# Patient Record
Sex: Female | Born: 1945 | Race: White | Hispanic: No | Marital: Married
Health system: Northeastern US, Academic
[De-identification: ages and names within clinical notes are randomized; demographics above are authoritative.]

---

## 2019-08-11 IMAGING — DX HIP 2 VIEWS LEFT WITH PELVIS
1 series · 3 of 3 positions shown · non-contrast
Comparison: None.

HIP 2 VIEWS LEFT WITH PELVIS, 08/11/2019 [DATE]: 
CLINICAL INDICATION:  Patient fell 2 weeks ago. Low back/left SI joint pain.

[Series 1: AP · U · 0.17mm/px · 3 of 3 slices shown]
[im 1/3]
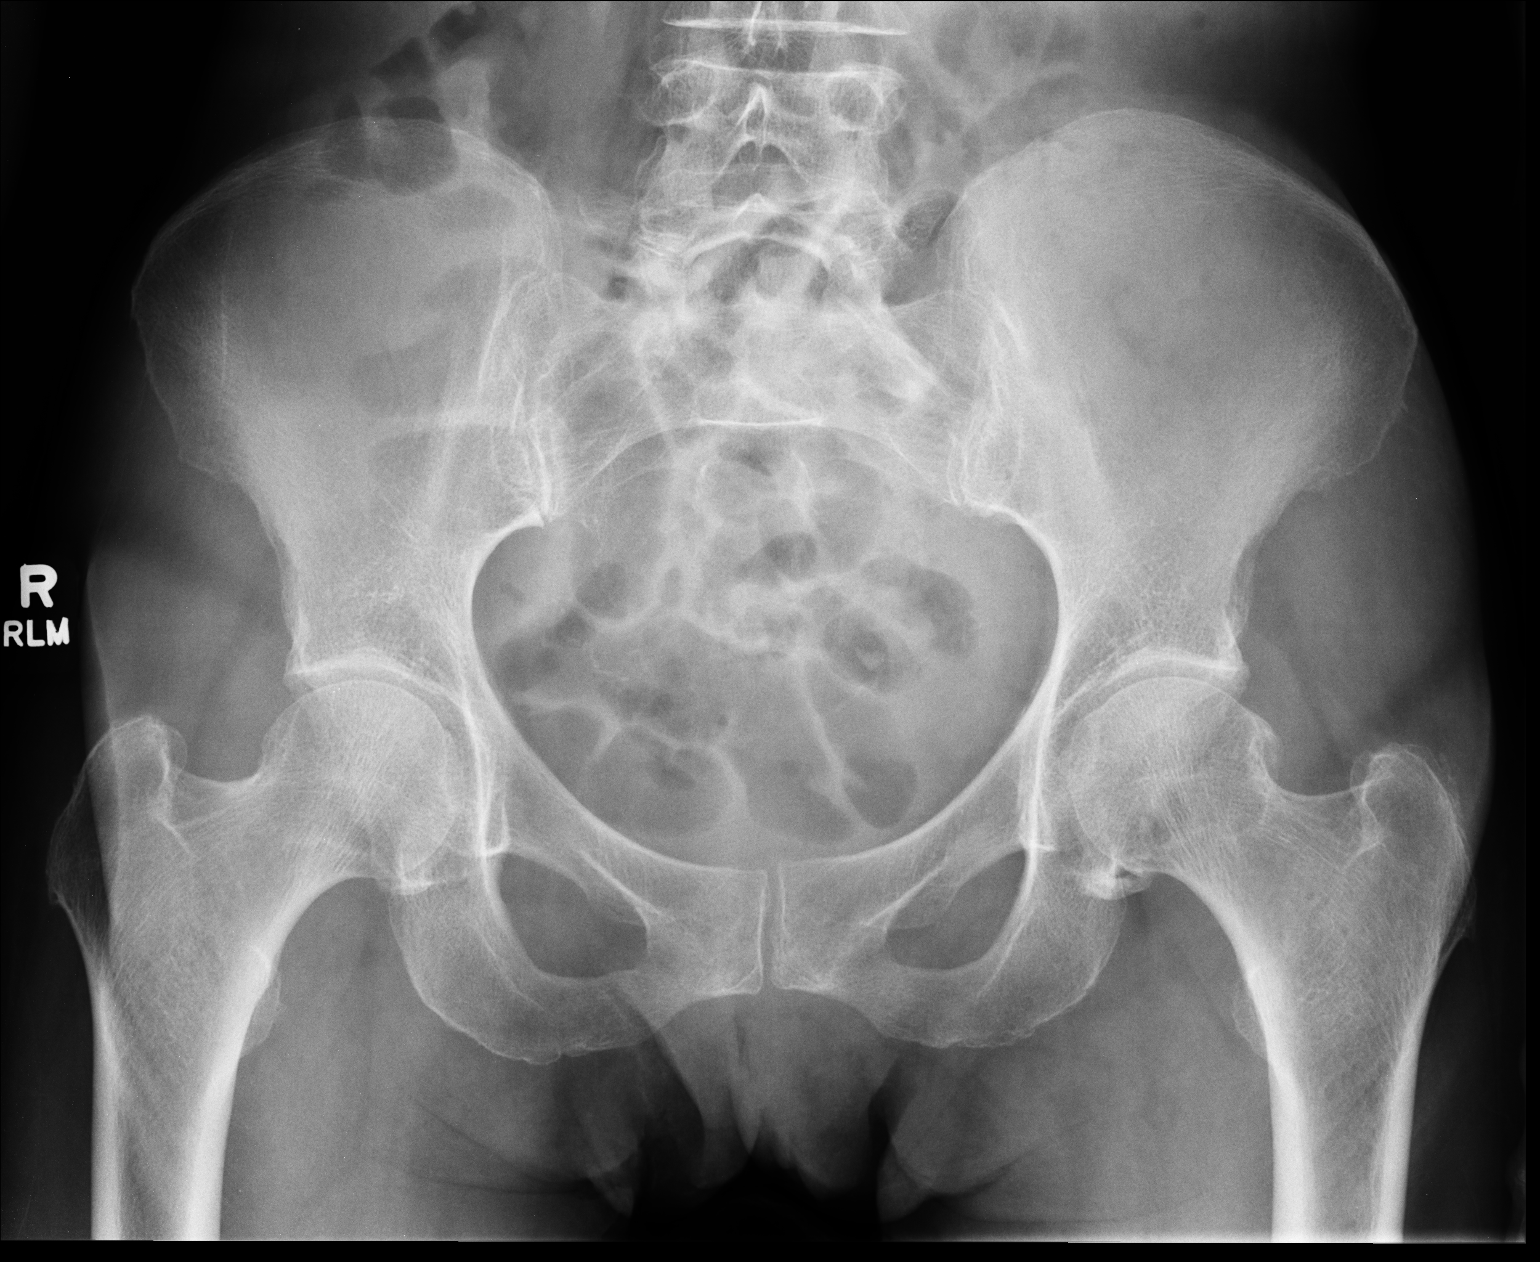
[im 2/3]
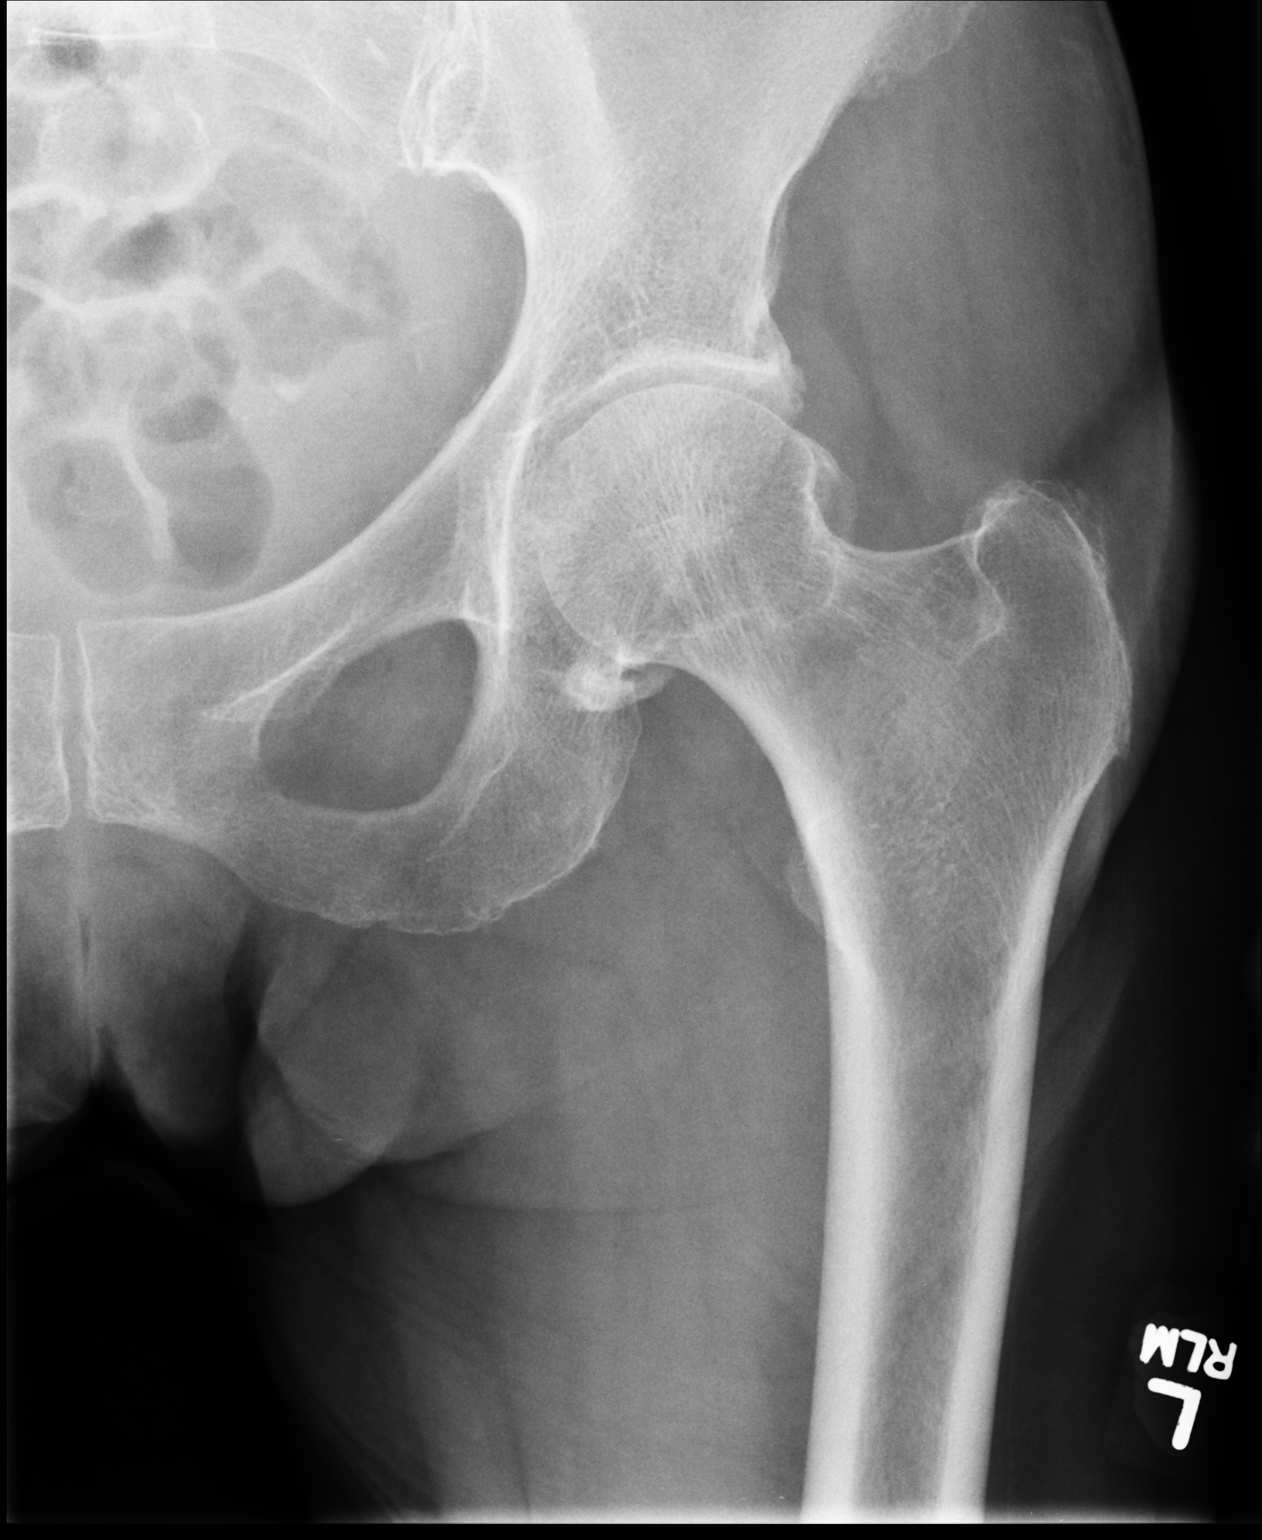
[im 3/3]
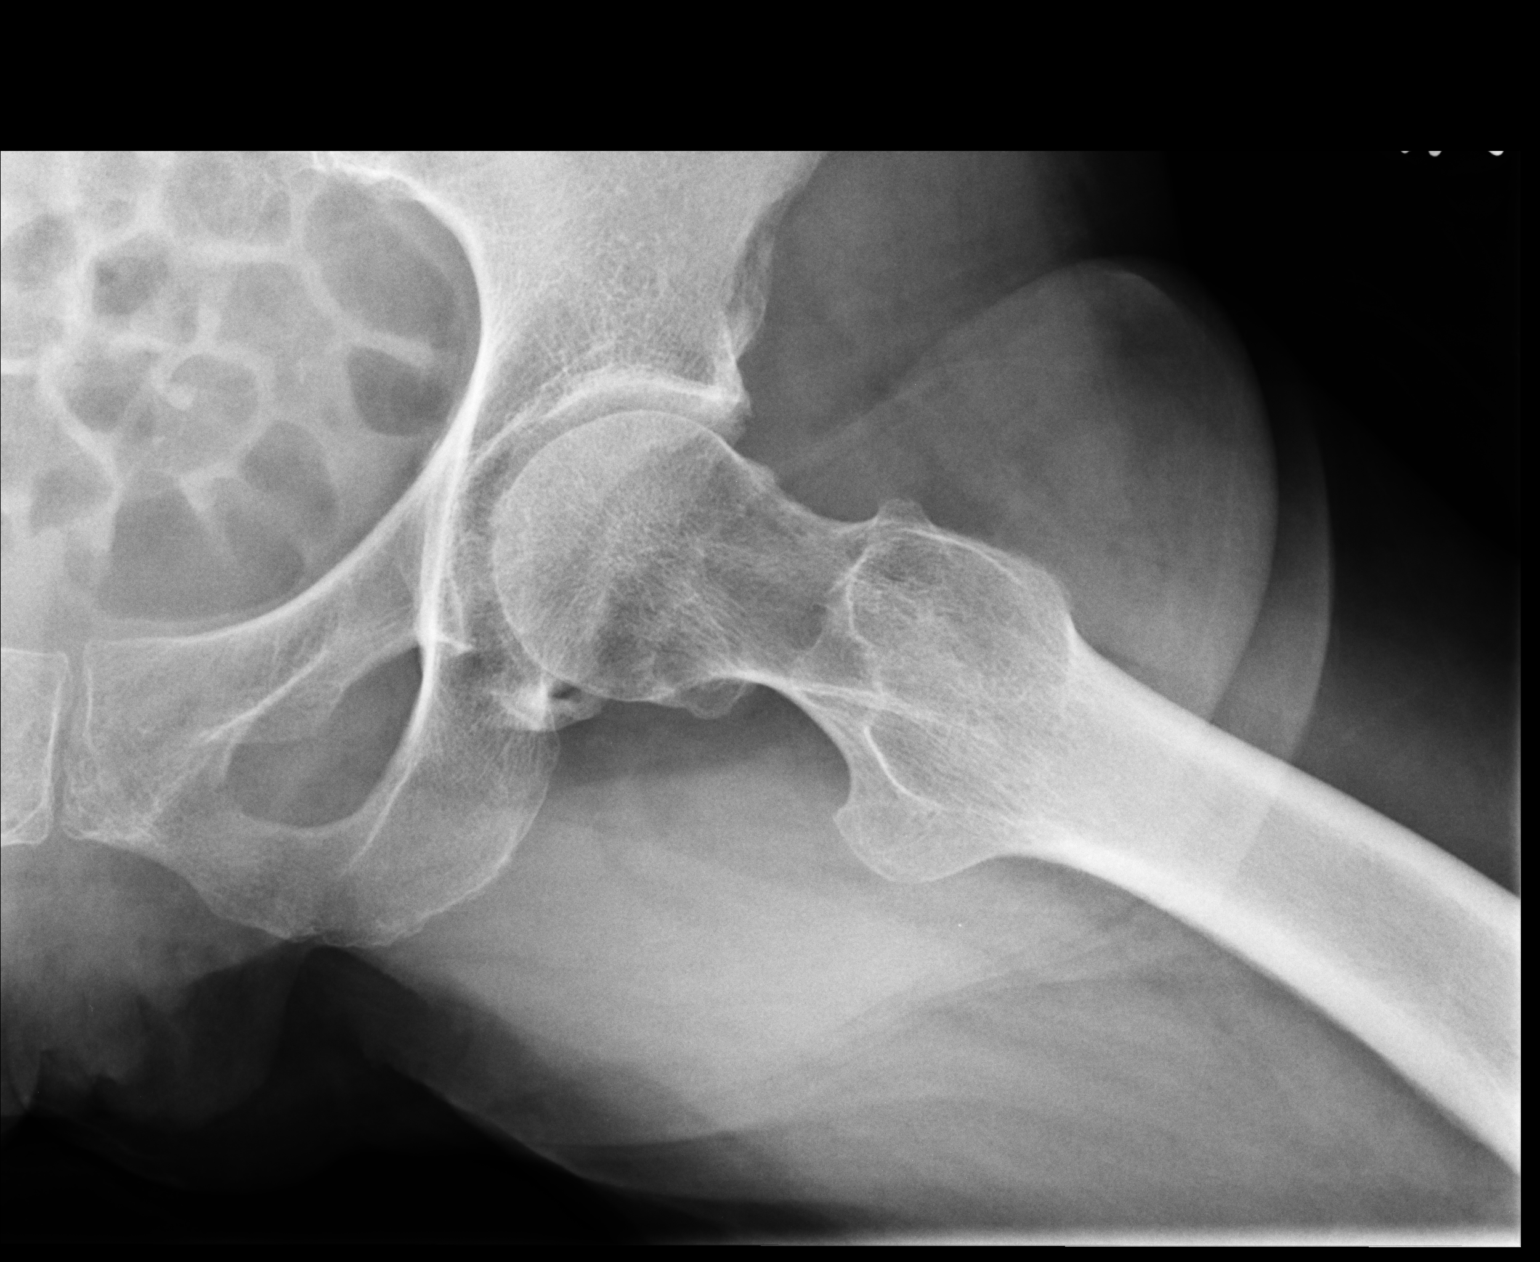

[3 of 3 positions shown; findings below may reference images not displayed]

FINDINGS: Moderate hypertrophic changes of the left hip with preserved superior 
joint space. Normal right hip. 2 mm offset of the pubic symphysis. Enthesopathy. 
Osteopenia. No fracture. Normal alignment. Soft tissues are negative.
IMPRESSION: Moderate degenerative change of the left hip.

## 2019-08-11 IMAGING — DX LUMBAR SPINE COMPLETE 4 VIEWS
1 series · 4 of 4 positions shown · non-contrast
Comparison: None

LUMBAR SPINE COMPLETE 4 VIEWS, 08/11/2019 [DATE]: 
CLINICAL INDICATION: Fell 2 weeks ago

[Series 1: AP · U · 0.17mm/px · 4 of 4 slices shown]
[im 1/4]
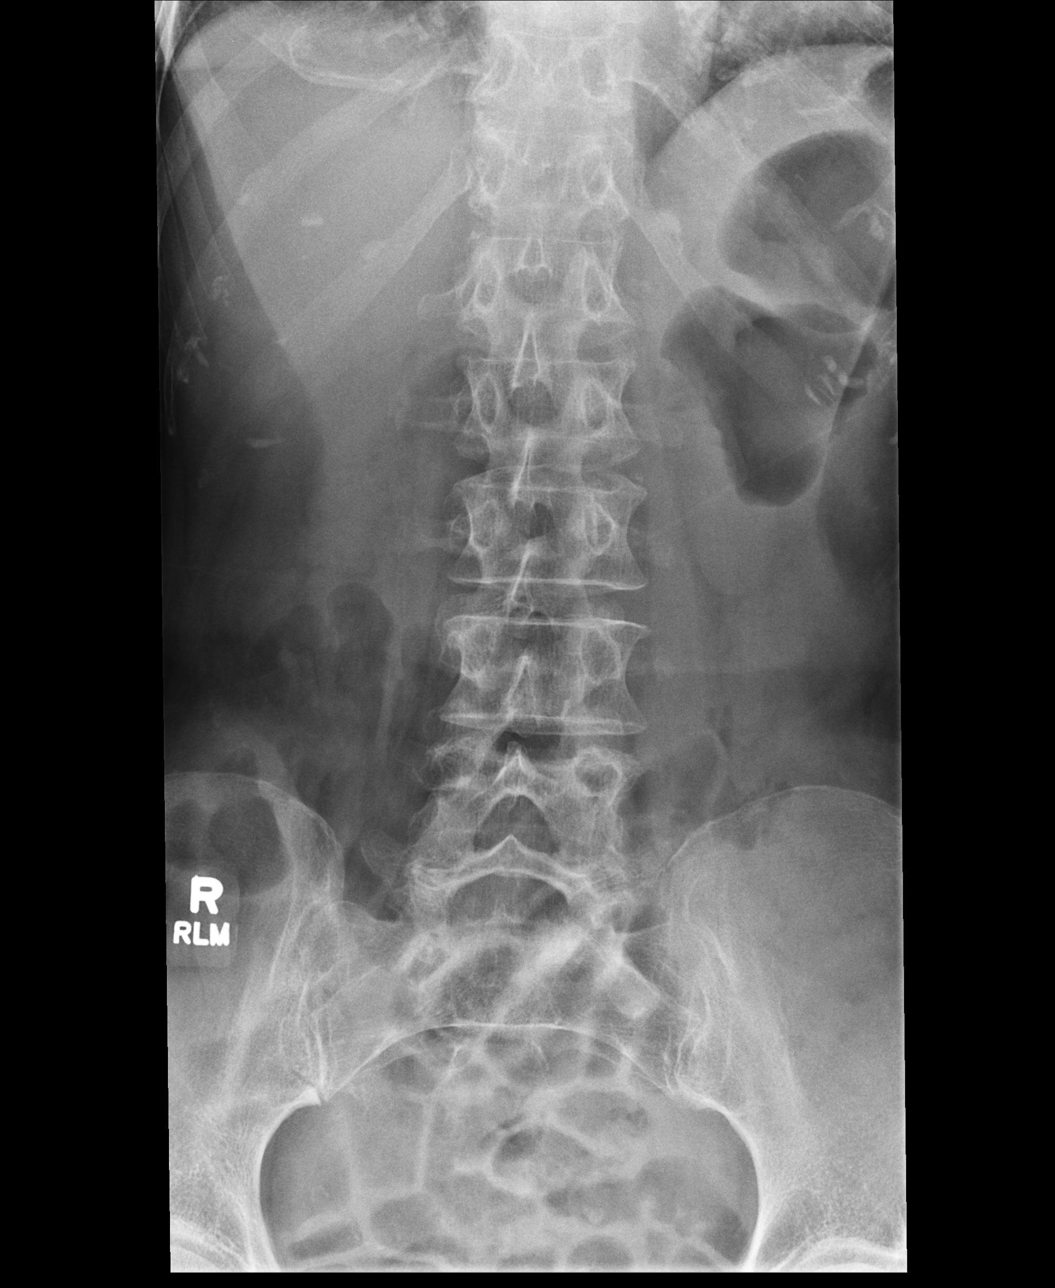
[im 2/4]
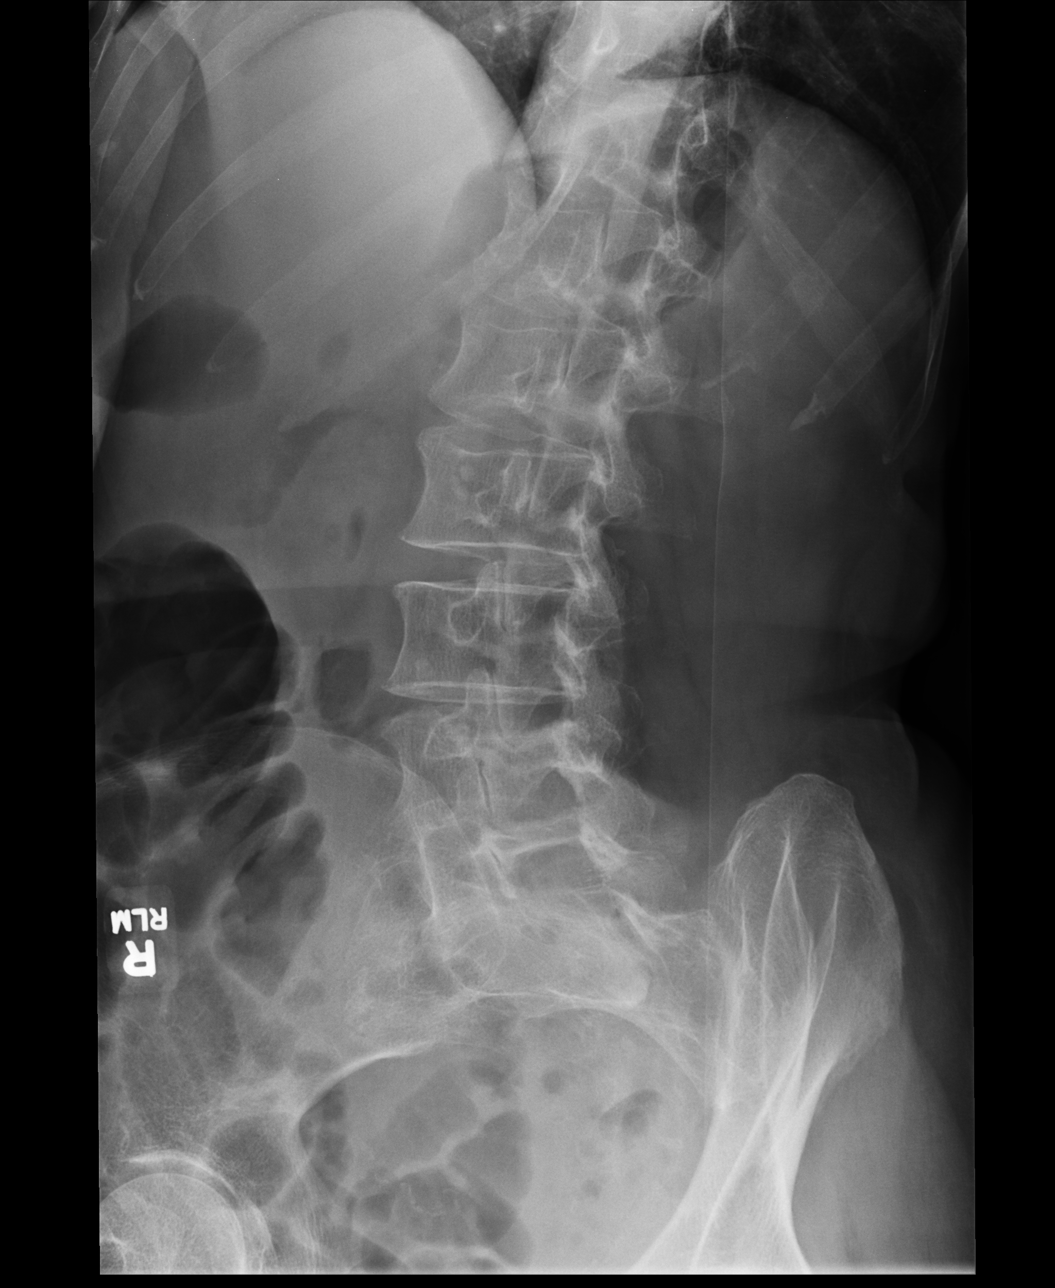
[im 3/4]
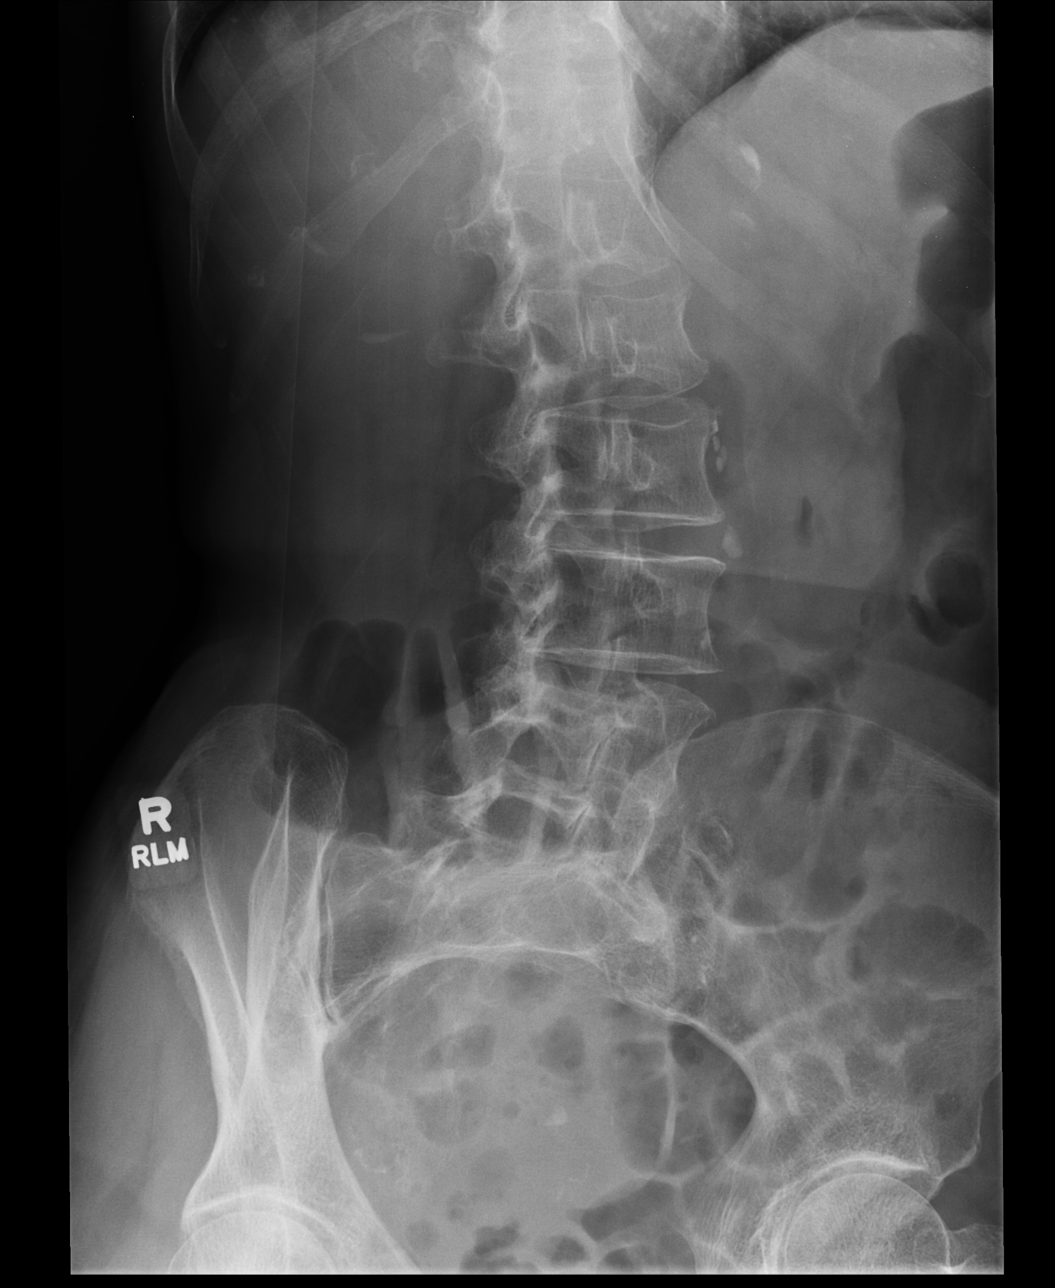
[im 4/4]
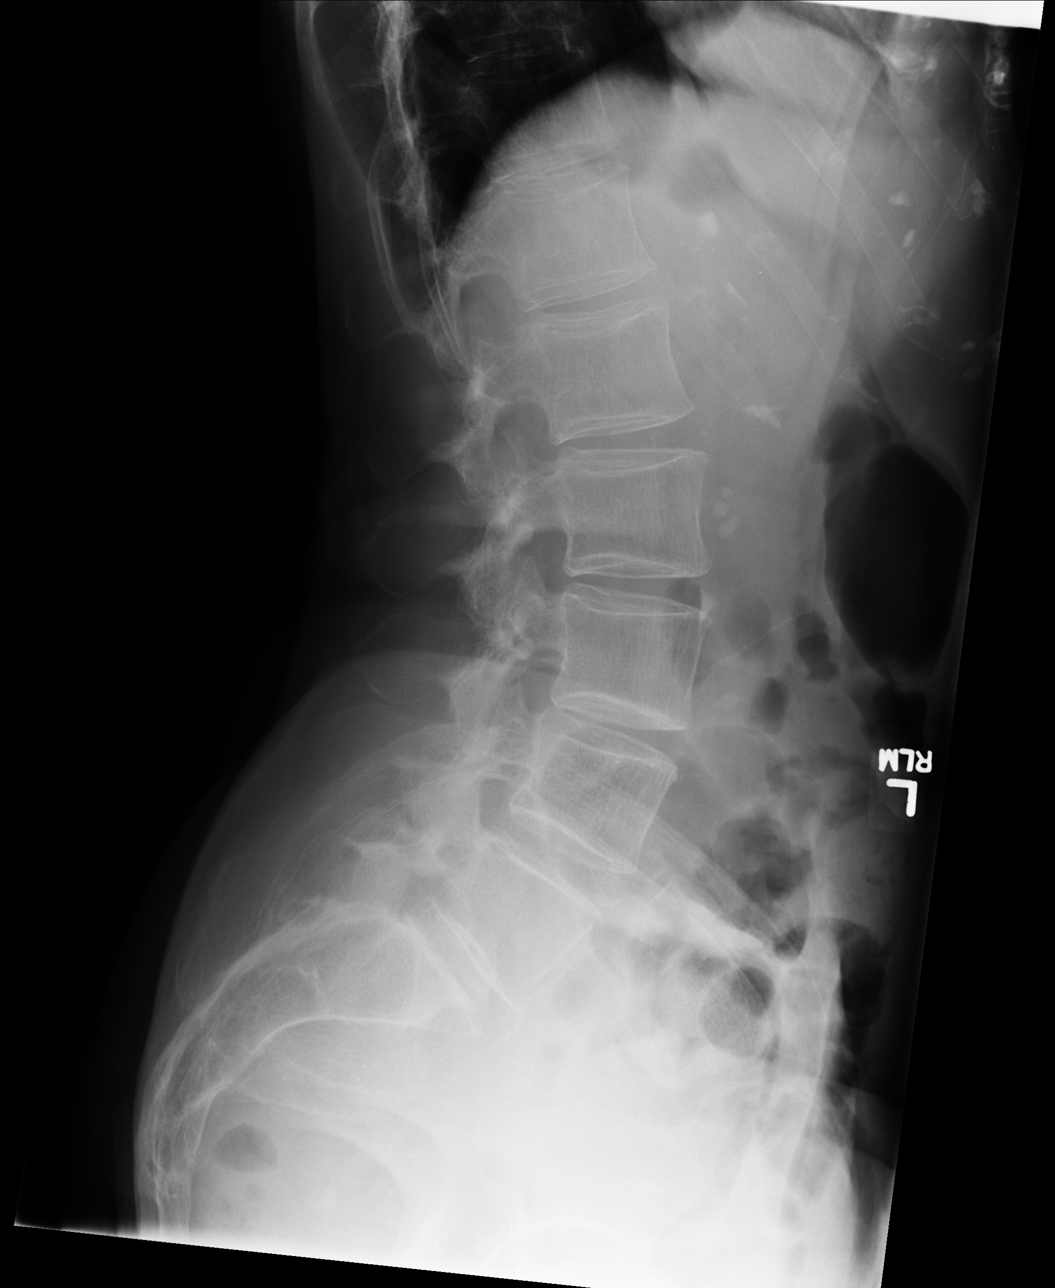

[4 of 4 positions shown; findings below may reference images not displayed]

FINDINGS: Lumbar vertebral heights are intact. There is no listhesis or fracture. No 
spondylolysis. Disc heights are preserved. There are facet changes, most 
pronounced at the lower 3 levels. There is no scoliosis. There are mild to 
moderate SI joint degenerative changes.
IMPRESSION: No evidence for fracture or listhesis. 
Degenerative changes, most pronounced in the lower lumbar facet and SI joints.

## 2019-09-11 ENCOUNTER — Telehealth: Admit: 2019-09-11 | Payer: PRIVATE HEALTH INSURANCE | Attending: Hematology & Oncology

## 2019-09-11 NOTE — Telephone Encounter
Pt called to cancel, she has new insurance and Dr Rico Junker is out of network for her.

## 2019-09-13 ENCOUNTER — Ambulatory Visit: Admit: 2019-09-13 | Payer: PRIVATE HEALTH INSURANCE | Attending: Hematology & Oncology

## 2019-11-04 ENCOUNTER — Encounter: Admit: 2019-11-04 | Payer: PRIVATE HEALTH INSURANCE

## 2019-11-04 ENCOUNTER — Inpatient Hospital Stay: Admit: 2019-11-04 | Discharge: 2019-11-04 | Payer: PRIVATE HEALTH INSURANCE

## 2019-11-04 ENCOUNTER — Emergency Department: Admit: 2019-11-04 | Payer: PRIVATE HEALTH INSURANCE

## 2019-11-04 DIAGNOSIS — C4490 Unspecified malignant neoplasm of skin, unspecified: Secondary | ICD-10-CM

## 2019-11-04 DIAGNOSIS — K5900 Constipation, unspecified: Secondary | ICD-10-CM

## 2019-11-04 DIAGNOSIS — E079 Disorder of thyroid, unspecified: Secondary | ICD-10-CM

## 2019-11-04 DIAGNOSIS — E785 Hyperlipidemia, unspecified: Secondary | ICD-10-CM

## 2019-11-04 DIAGNOSIS — M79641 Pain in right hand: Secondary | ICD-10-CM

## 2019-11-04 DIAGNOSIS — K589 Irritable bowel syndrome without diarrhea: Secondary | ICD-10-CM

## 2019-11-04 DIAGNOSIS — K5790 Diverticulosis of intestine, part unspecified, without perforation or abscess without bleeding: Secondary | ICD-10-CM

## 2019-11-04 MED ORDER — SODIUM CHLORIDE 0.9 % BOLUS (NEW BAG)
0.9 % | INTRAVENOUS | Status: CP
Start: 2019-11-04 — End: ?
  Administered 2019-11-05: 01:00:00 0.9 mL/h via INTRAVENOUS

## 2019-11-04 MED ORDER — SODIUM CHLORIDE 0.9 % (FLUSH) INJECTION SYRINGE
0.9 % | INTRAVENOUS | Status: DC | PRN
Start: 2019-11-04 — End: 2019-11-05
  Administered 2019-11-05: 01:00:00 0.9 mL via INTRAVENOUS

## 2019-11-04 MED ORDER — IOHEXOL 350 MG IODINE/ML INTRAVENOUS SOLUTION
350 mg iodine/mL | Freq: Once | INTRAVENOUS | Status: CP | PRN
Start: 2019-11-04 — End: ?
  Administered 2019-11-05: 01:00:00 350 mL via INTRAVENOUS

## 2019-11-04 NOTE — ED Notes
Patient reports a long standing hx of bowel issues.  Reports that she has been constipated for a while   Took ducolax last night and did have a BM but reports that she does not feel empty.  Patient reports a fullness to her abd, slight pain. Denies any nausea or vomiting

## 2019-11-05 DIAGNOSIS — G8929 Other chronic pain: Secondary | ICD-10-CM

## 2019-11-05 DIAGNOSIS — Z85828 Personal history of other malignant neoplasm of skin: Secondary | ICD-10-CM

## 2019-11-05 DIAGNOSIS — K589 Irritable bowel syndrome without diarrhea: Secondary | ICD-10-CM

## 2019-11-05 DIAGNOSIS — R109 Unspecified abdominal pain: Secondary | ICD-10-CM

## 2019-11-05 DIAGNOSIS — E785 Hyperlipidemia, unspecified: Secondary | ICD-10-CM

## 2019-11-05 DIAGNOSIS — Z8719 Personal history of other diseases of the digestive system: Secondary | ICD-10-CM

## 2019-11-05 DIAGNOSIS — Z79899 Other long term (current) drug therapy: Secondary | ICD-10-CM

## 2019-11-05 DIAGNOSIS — Z86010 Personal history of colonic polyps: Secondary | ICD-10-CM

## 2019-11-05 LAB — BASIC METABOLIC PANEL
BKR ANION GAP (LM): 6 mmol/L (ref 5–15)
BKR BLOOD UREA NITROGEN: 13 mg/dL (ref 7–18)
BKR CALCIUM: 9.1 mg/dL — ABNORMAL LOW (ref 8.5–10.1)
BKR CHLORIDE: 101 mmol/L (ref 98–107)
BKR CO2: 26 mmol/L (ref 21–32)
BKR CREATININE: 0.61 mg/dL (ref 0.55–1.02)
BKR EGFR (AFR AMER) (LMC): >60 mL/min/{1.73_m2} (ref >60)
BKR EGFR (NON AFR AMER) (LMC): >60 mL/min/{1.73_m2} (ref >60)
BKR GLUCOSE: 93 mg/dL (ref 65–110)
BKR POTASSIUM: 4.1 mmol/L (ref 3.5–5.1)
BKR SODIUM: 133 mmol/L — ABNORMAL LOW (ref 136–145)
BKR WAM RDW-CV: 6 mmol/L (ref 5–15)

## 2019-11-05 LAB — CBC WITH AUTO DIFFERENTIAL
BKR WAM ABSOLUTE IMMATURE GRANULOCYTES.: 0.01 x 1000/??L (ref 0.00–0.10)
BKR WAM ABSOLUTE LYMPHOCYTE COUNT.: 1.23 x 1000/??L (ref 1.00–4.50)
BKR WAM ABSOLUTE NEUTROPHIL COUNT.: 2.71 x 1000/??L (ref 1.40–6.60)
BKR WAM BASOPHIL ABSOLUTE COUNT.: 0.05 x 1000/??L (ref 0.0–0.3)
BKR WAM BASOPHILS: 1.1 % (ref 0.0–3.0)
BKR WAM EOSINOPHIL ABSOLUTE COUNT.: 0.04 x 1000/??L (ref 0.00–0.45)
BKR WAM EOSINOPHILS: 0.9 % (ref 0.0–4.0)
BKR WAM HEMATOCRIT: 34.3 % (ref 34.0–45.0)
BKR WAM HEMOGLOBIN: 11.3 g/dL (ref 11.0–15.0)
BKR WAM IMMATURE GRANULOCYTES: 0.2 % (ref 0.0–1.0)
BKR WAM LYMPHOCYTES: 27.6 % (ref 25.0–45.0)
BKR WAM MCH PG: 34.1 pg — ABNORMAL HIGH (ref 27–33)
BKR WAM MCHC: 32.9 g/dL (ref 32.0–36.0)
BKR WAM MCV: 103.6 fL — ABNORMAL HIGH (ref 79.0–99.0)
BKR WAM MONOCYTE ABSOLUTE COUNT.: 0.42 x 1000/??L (ref 0.00–1.20)
BKR WAM MONOCYTES: 9.4 % (ref 0.0–12.0)
BKR WAM MPV: 10.3 fL (ref 7.5–11.5)
BKR WAM NEUTROPHILS: 60.8 % (ref 36.0–66.0)
BKR WAM NUCLEATED RED BLOOD CELLS: 0 % (ref 0.0–5.0)
BKR WAM PLATELETS: 124 x1000/ÂµL — ABNORMAL LOW (ref 140–400)
BKR WAM RED BLOOD CELL COUNT.: 3.31 M/ÂµL — ABNORMAL LOW (ref 4.00–5.20)
BKR WAM WHITE BLOOD CELL COUNT.: 4.46 x1000/ÂµL (ref 4.00–10.00)

## 2019-11-05 NOTE — ED Notes
8:05 PM Patient is awake and alert, reports many years of intermittent constipation w/ reliance on laxatives to have a BM.  Today patient c/o fullness and nausea following a month of constipation, patient has had BM 2 days ago and today but still feels constipated.  Denies vomiting, chest pain or SOB.

## 2019-11-08 NOTE — ED Provider Notes
Chief Complaint Patient presents with ? Constipation Debra [name] is a 74 y.o. female  who presents to the ED complaining of constipation.  States that it has been severe.  Then notes that she has had several episodes of soft stool today and yesterday as well.  Hx of irritable bowel syndrome. Occasional cramping abdominal pain. No other associated symptoms or modifying factors.Past Medical History: Diagnosis Date ? Bilateral hand pain 11/11/2017 ? Disease of thyroid gland  ? Diverticulosis  ? Hyperlipidemia  ? IBS (irritable bowel syndrome)  ? Skin cancer  Past Surgical History: Procedure Laterality Date ? CATARACT REMOVAL/IOL IMPLANT   ? COLONOSCOPY  11/25/2010  Diverticulosis of the sigmoid colon ? COLONOSCOPY  02/26/2015  Diverticulosis of the sigmoid colon. Polyps (3 mm to 4 mm) in the mid-ascending colon. (Polypectomy). Polyp (4 mm to 6 mm) in the distal ascending colon. (Polypectomy).  ? EGD/Colonoscopy  07/20/2005  Nonerosive antral gastritis; diverticulosis coli. Bx shows chronic non specific inflammation ? EGD/Colonoscopy  09/11/1999  Normal EGD; sigmoid diverticulosis.  ? ESOPHAGOGASTRODUODENOSCOPY  04/16/2009  Abnormally visible vascularity with absence of folds in the gastric body compatible with gastric mucosal atrophy. (Biopsy) ? MOLE REMOVAL  2006 ? OOPHORECTOMY  1961 ? SKIN CANCER EXCISION    R arm (2017) and R ankle (2003) ? THYROIDECTOMY  1969 Family History Problem Relation Age of Onset ? Diabetes Daughter  ? Breast cancer Maternal Aunt  ? Colon cancer Maternal Aunt  ? Colon cancer Maternal Grandfather  Social History Socioeconomic History ? Marital status: Married   Spouse name: Not on file ? Number of children: Not on file ? Years of education: Not on file ? Highest education level: Not on file Tobacco Use ? Smoking status: Never Smoker ? Smokeless tobacco: Never Used Substance and Sexual Activity ? Alcohol use: Yes   Alcohol/week: 2.0 standard drinks   Types: 2 Glasses of wine per week ? Drug use: No ED Other Social History Review of Systems:All systems are reviewed except as noted above or in the HPI are negative.Vitals:  11/04/19 1845 11/04/19 2215 BP: 132/87 130/71 Pulse: 66 66 Resp: 17 16 Temp: 97 ?F (36.1 ?C)  TempSrc: Temporal  SpO2: 100%  Weight: 50.5 kg  Physical Exam:General: WNWD, Alert.  No acute distress.  Eyes: Pupils equal, round and reactive to light.  ENT: Mucous membranes moistNeck: Neck supple.  CVS:  Regular rateRespiratory:  No increased work of breathingAbdomen: Soft and nontender.  No rebound, rigidity or guarding.Back: Normal inspection.  Skin: Warm and dry and well perfused.  No lesions.Extremities: No lower extremity edema.  Neuro: Oriented X 3, ambulates well in the department.Psych: Calm and cooperative.  Mood and affect normal.Procedures:LABSI have reviewed all labs for this visit. No results found for this or any previous visit (from the past 24 hour(s)).The 12 lead EKG was interpreted by me as follows:RADIOLOGYCt Abdomen Pelvis Mardelle Matte Date: 7/11/2021[facility] [address] [place], [place] [address] [phone] [place] ABDOMEN PELVIS W IV CONTRAST Debra [name]       SexCarmon Ginsberg  DOB: [id number]  MRN: [id number] Service Date: 2019-11-04 20:54:56 [place] ABDOMEN PELVIS W IV CONTRAST Clinical Indication:  Abdominal pain, acute, nonlocalized Contrast:  90 ml of Omnipaque 350 The [place] scan was performed in the axial projection.  Sagittal and coronal reformatted images were obtained.  The [place] scan of the abdomen and pelvis was performed with the use of intravenous contrast. The patient did not receive oral contrast. Study is compared to a prior study from  May 26, 2016. There are several small low-density masses in the liver which probably represents a cyst. Some of these are too small to accurately characterize. Spleen is normal in size. The pancreas is not well visualized but is likely atrophic. The gallbladder is also not adequately visualized. There is no hydronephrosis of the kidneys. There is a right renal cyst. The abdominal aorta is normal caliber. Evaluation of the bowel is limited without the use of oral contrast and due to the paucity of intra-abdominal fat. There are several air-fluid levels seen within the small bowel which are nonspecific and could be due to enteritis. A mild or partial small-bowel obstruction cannot be excluded. There is air and stool seen in the colon. No obvious diverticulitis is seen however the colon is also difficult to evaluate. There is no evidence of obstruction of the colon. No obvious pelvic mass or free fluid is seen. Impression:  Evaluation of the bowel is somewhat limited without the use of oral contrast and due to the paucity of intra-abdominal fat. There are multiple air-fluid levels seen within the small bowel. The findings could be due to enteritis. A mild partial small-bowel obstruction cannot be excluded. Clinical correlation and if indicated follow up evaluation is suggested. Z6109 - Radiation dose acquired during scan: 167.19 mGy.cm mGy/cm . The [employer] strives for high quality imaging with the lowest possible radiation dose.  For more information on medical radiation exposure please visit:  [email or web address] . Signed By: Dalbert Batman MD, 2019/11/05 22:57:52Medications iohexoL (OMNIPAQUE) 350 mg iodine/mL injection 90 mL (90 mLs Intravenous Given 11/04/19 2055) sodium chloride 0.9 % (new bag) bolus 60 mL (0 mLs Intravenous Stopped 11/04/19 2247) Clinical Impressions as of Nov 06 2252 Chronic abdominal pain Clinical Impression: Chronic abdominal painSafe to be discharged to home.  Patient was advised to follow-up with PCP and/or specialty service in a timely manner.  Patient was advised to return to the ER should the condition change or should the condition worsen. Additional verbal instructions discussed with the patient at the bedside. Did voice understanding of those instructions. Patient did thank [place] for our care here today. Patient was discharged in stable condition.ED Disposition:Discharge Traci Sermon, DO07/13/21 2300

## 2020-01-22 ENCOUNTER — Encounter: Admit: 2020-01-22 | Payer: PRIVATE HEALTH INSURANCE

## 2020-01-22 DIAGNOSIS — K5900 Constipation, unspecified: Secondary | ICD-10-CM

## 2020-01-24 ENCOUNTER — Encounter: Admit: 2020-01-24 | Payer: PRIVATE HEALTH INSURANCE

## 2020-01-24 DIAGNOSIS — K5909 Other constipation: Secondary | ICD-10-CM

## 2020-01-29 ENCOUNTER — Inpatient Hospital Stay: Admit: 2020-01-29 | Discharge: 2020-01-29 | Payer: PRIVATE HEALTH INSURANCE

## 2020-01-29 DIAGNOSIS — K5900 Constipation, unspecified: Secondary | ICD-10-CM

## 2020-01-30 ENCOUNTER — Inpatient Hospital Stay: Admit: 2020-01-30 | Discharge: 2020-01-30 | Payer: PRIVATE HEALTH INSURANCE

## 2020-01-30 DIAGNOSIS — K5909 Other constipation: Secondary | ICD-10-CM

## 2020-01-31 ENCOUNTER — Inpatient Hospital Stay: Admit: 2020-01-31 | Discharge: 2020-01-31 | Payer: PRIVATE HEALTH INSURANCE

## 2020-01-31 DIAGNOSIS — K5909 Other constipation: Secondary | ICD-10-CM

## 2020-02-01 ENCOUNTER — Inpatient Hospital Stay: Admit: 2020-02-01 | Discharge: 2020-02-01 | Payer: PRIVATE HEALTH INSURANCE

## 2020-02-01 DIAGNOSIS — K5909 Other constipation: Secondary | ICD-10-CM

## 2020-02-02 ENCOUNTER — Encounter: Admit: 2020-02-02 | Payer: PRIVATE HEALTH INSURANCE

## 2020-02-02 ENCOUNTER — Inpatient Hospital Stay: Admit: 2020-02-02 | Discharge: 2020-02-02 | Payer: PRIVATE HEALTH INSURANCE

## 2020-02-02 DIAGNOSIS — K5900 Constipation, unspecified: Secondary | ICD-10-CM

## 2020-02-02 DIAGNOSIS — K5909 Other constipation: Secondary | ICD-10-CM

## 2020-02-03 ENCOUNTER — Inpatient Hospital Stay: Admit: 2020-02-03 | Discharge: 2020-02-03 | Payer: PRIVATE HEALTH INSURANCE

## 2020-02-03 DIAGNOSIS — K5900 Constipation, unspecified: Secondary | ICD-10-CM

## 2020-03-24 ENCOUNTER — Encounter: Admit: 2020-03-24 | Payer: PRIVATE HEALTH INSURANCE | Attending: Hematology & Oncology

## 2020-03-24 DIAGNOSIS — E538 Deficiency of other specified B group vitamins: Secondary | ICD-10-CM

## 2020-03-25 MED ORDER — BD LUER-LOK SYRINGE 3 ML 25 X 5/8
3 mL 25 x 5/8" | 2 refills | Status: AC
Start: 2020-03-25 — End: ?

## 2020-03-25 MED ORDER — CYANOCOBALAMIN (VIT B-12) 1,000 MCG/ML INJECTION SOLUTION
1000 mcg/mL | INTRAMUSCULAR | 2 refills | Status: AC
Start: 2020-03-25 — End: ?

## 2020-07-09 ENCOUNTER — Encounter: Admit: 2020-07-09 | Payer: PRIVATE HEALTH INSURANCE

## 2020-07-29 IMAGING — MR MRI LUMBAR SPINE WITHOUT CONTRAST
4 of 6 series · 22 of 48 positions shown · IV contrast (gadolinium)
Comparison: 08/11/2019 radiographs

MRI LUMBAR SPINE WITHOUT CONTRAST, 07/29/2020 [DATE]: 
CLINICAL INDICATION: Chronic low back pain radiating to the right leg. History 
of lumbar discectomy in 3687.
TECHNIQUE: Multiplanar, multiecho position MR images of the lumbar spine were 
performed without intravenous gadolinium enhancement.

[Series 101: survey · axial · 10.0mm · 1.39mm/px · z∈[+15,+199]mm · 3 of 14 slices shown]
[im 3/14]
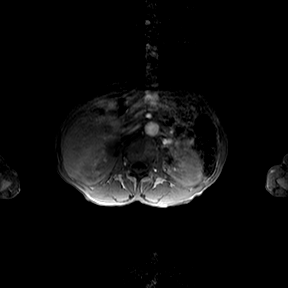
[im 8/14]
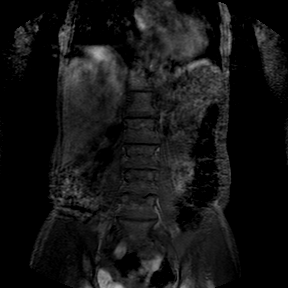
[im 14/14]
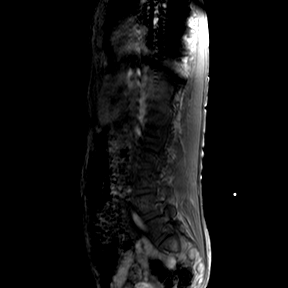

[Series 301: t1w tse sag · sagittal · 4.0mm · 0.48mm/px · 3 of 17 slices shown]
[im 4/17]
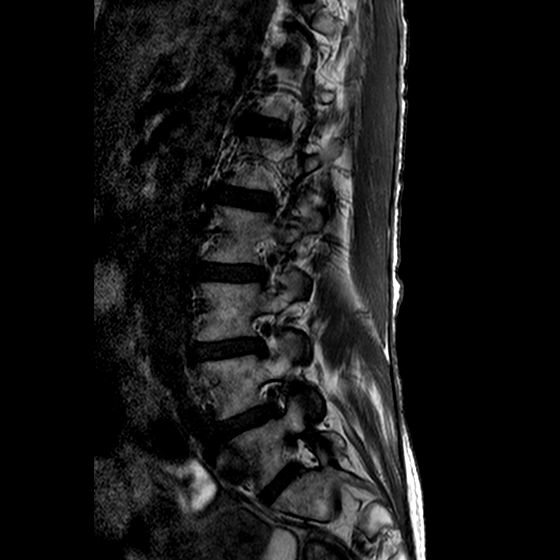
[im 10/17]
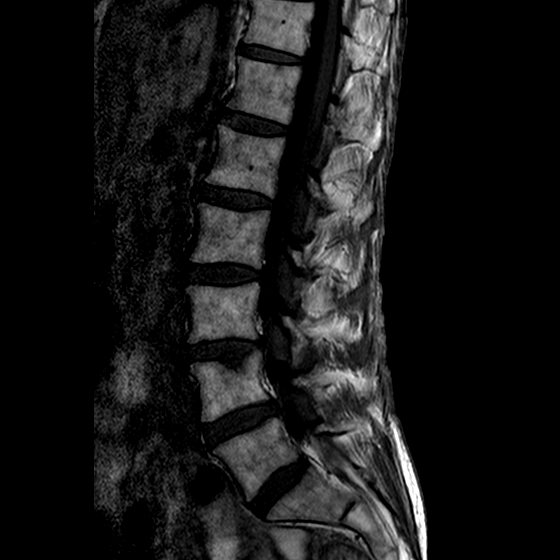
[im 17/17]
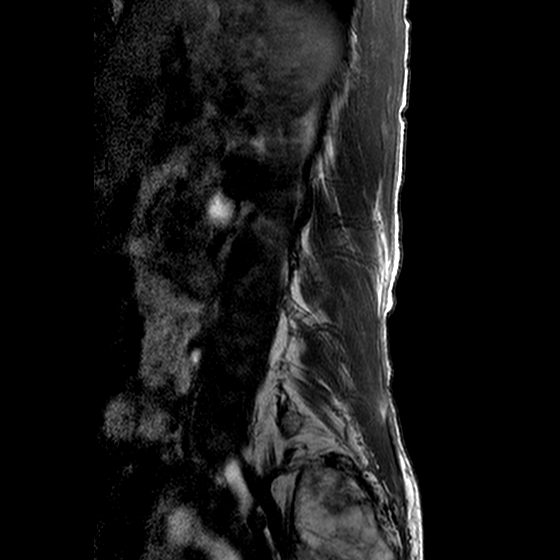

[Series 601: T2 · oblique · 4.0mm · 0.47mm/px · 11 of 31 slices shown]
[im 1/31]
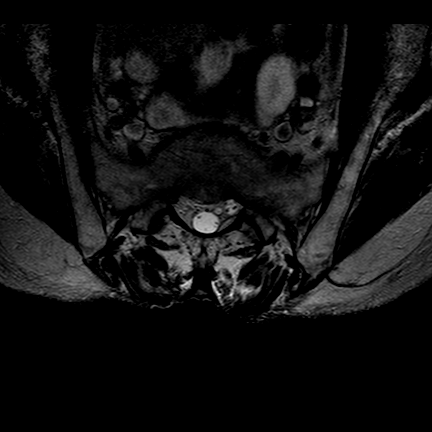
[im 4/31]
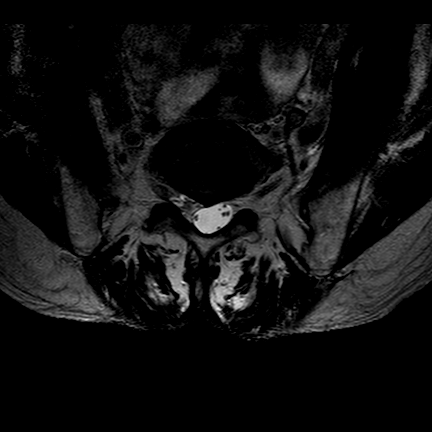
[im 7/31]
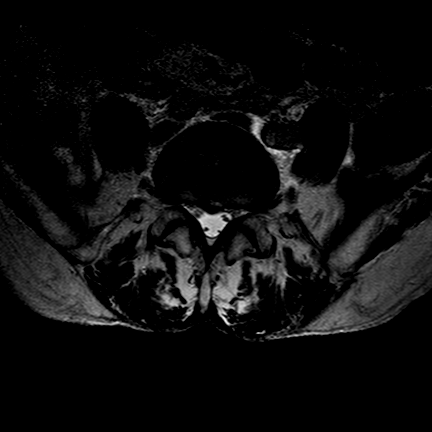
[im 10/31]
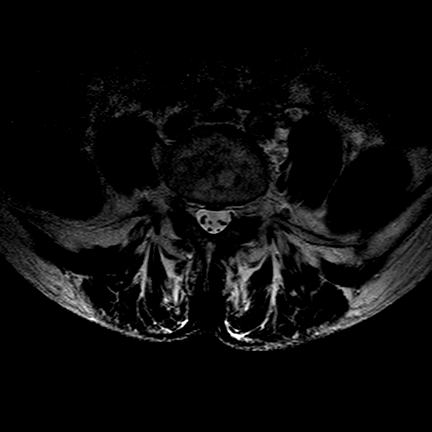
[im 13/31]
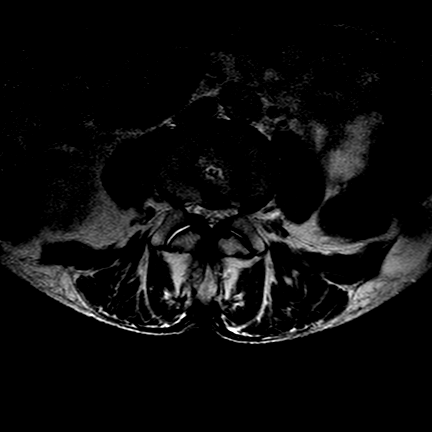
[im 16/31]
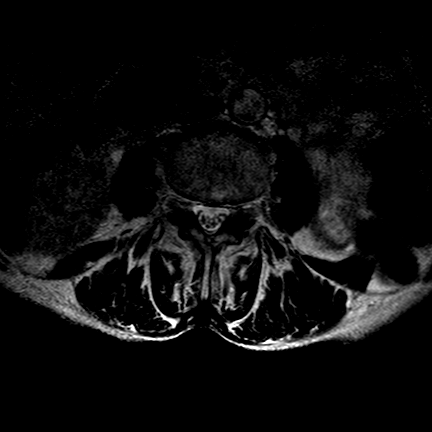
[im 19/31]
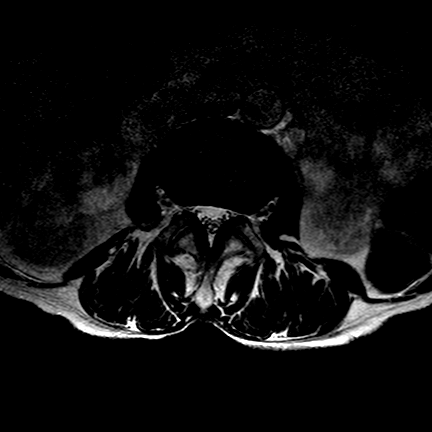
[im 22/31]
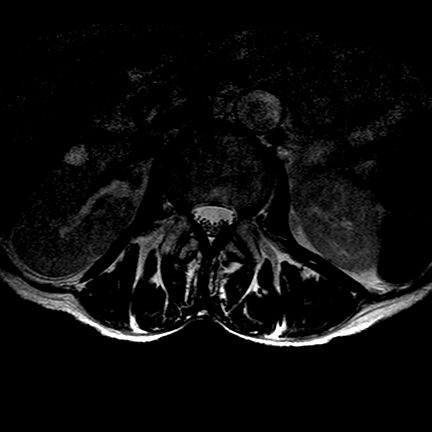
[im 25/31]
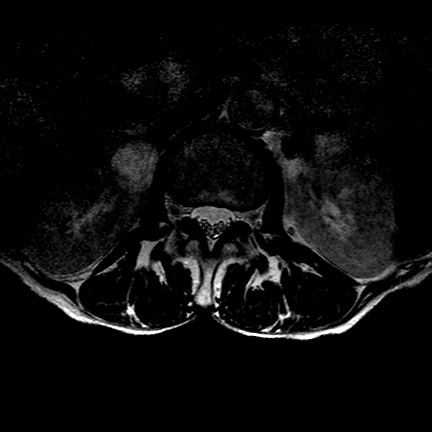
[im 28/31]
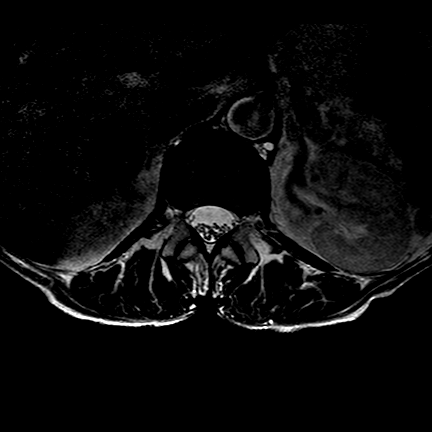
[im 31/31]
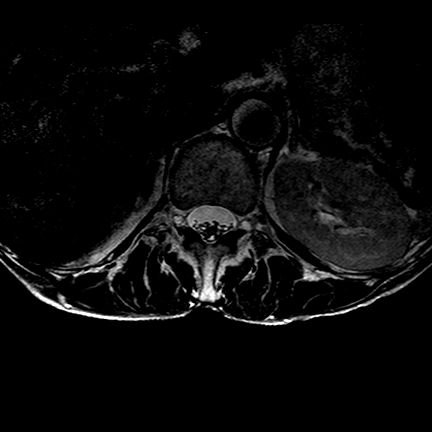

[Series 701: T1 · oblique · 4.0mm · 0.35mm/px · 5 of 35 slices shown]
[im 1/35]
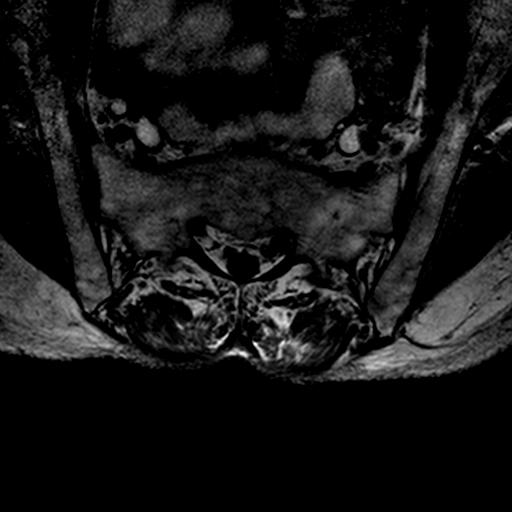
[im 6/35]
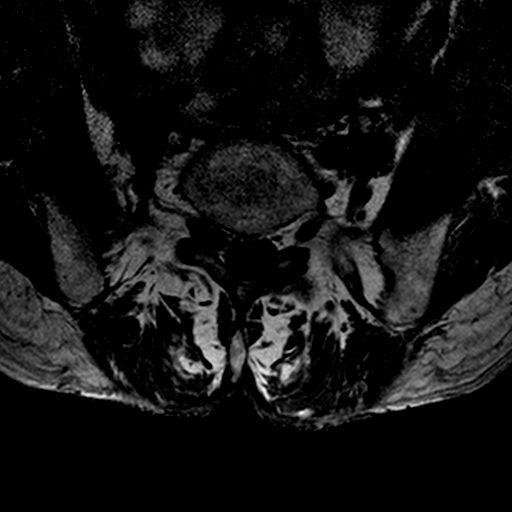
[im 12/35]
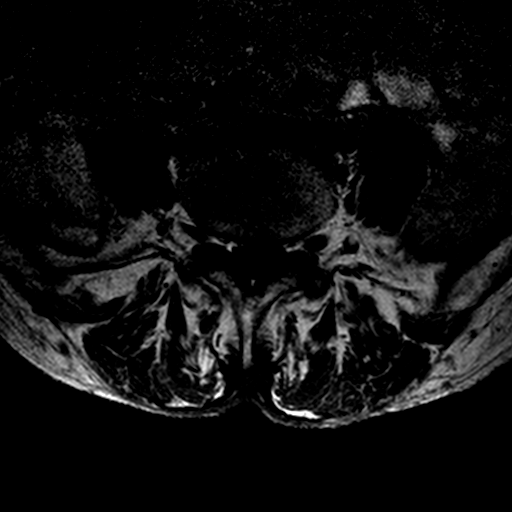
[im 18/35]
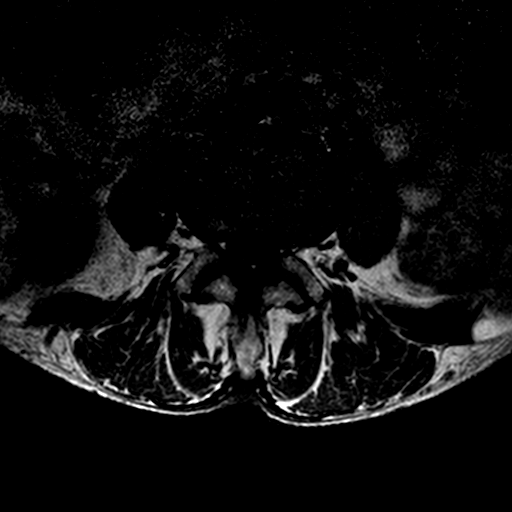
[im 29/35]
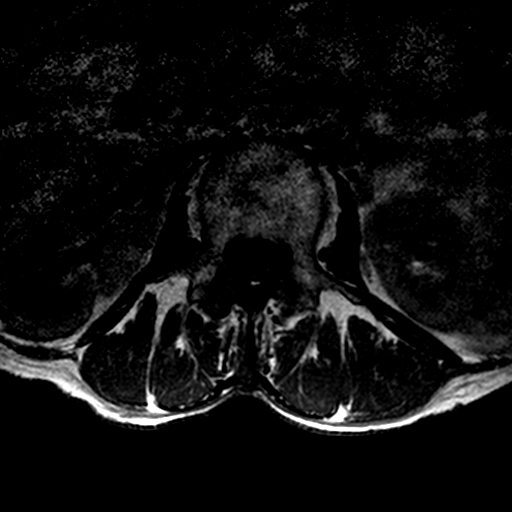

[22 of 48 positions shown; findings below may reference images not displayed]

FINDINGS: Nomenclature is based on 5 lumbar type vertebral bodies.  Mild chronic 
posterior L4 vertebral body superior endplate compression deformity (19% loss of 
height) and Schmorl node, with mild marrow edema-like signal changes in the 
posterior vertebral body. Multilevel degenerative change. 2 mm anterior 
positioning of L3 upon L4. No abnormal marrow signal intensity. No pars defect. 
The conus tip terminates at the T12-L1 level. 
Modic I-II: None. 
Ligamentum Flavum > 2.5 mm: All levels 
T12-L1: The disc is normal in height and signal. No disc herniation. Normal 
facets. Ligamentum flavum hypertrophy. No spinal canal or neural foraminal 
stenosis. 
L1-L2: Mild disc space narrowing and disc desiccation. No disc herniation. Mild 
facet arthropathy and ligamentum flavum hypertrophy. Mild lateral recess 
stenosis. No central canal or neural foraminal stenosis. 
L2-L3: Disc bulge. The disc is normal in height with disc desiccation. No disc 
herniation. Mild facet arthropathy and ligamentum flavum hypertrophy. Mild 
central canal/lateral recess stenosis. Mild neural foraminal stenosis. 
L3-L4: Annular tear in the left subarticular region. Disc bulge. Compensatory 
widening of the disc due to chronic L4 compression fracture and disc signal 
heterogeneity. Moderate facet arthropathy and marked ligamentum flavum 
hypertrophy. Moderate central canal stenosis (thecal sac measures 0.7 cm in AP 
dimensions) and moderate to marked lateral recess stenosis, with encroachment on 
the bilateral L4 nerve roots. Moderate neural foraminal stenosis. 
L4-L5: Central disc protrusion measures 0.3 cm in AP dimensions. Disc 
desiccation. Mild facet arthropathy and left posterior synovial cysts (up to
cm). Ligamentum flavum hypertrophy. Mild central canal and moderate lateral 
recess stenosis, with encroachment on the L5 nerve roots. Mild neural foraminal 
stenosis. 
L5-S1: The disc is normal in height and signal. No disc herniation. Right 
foraminal perineural cysts. Mild facet arthropathy. No central canal stenosis. 
Mild right neural foraminal stenosis. 
OTHER: 2.5 cm right mid renal cyst. The aorta is normal in diameter. The 
posterior paraspinal soft tissues are negative.
IMPRESSION: 1.  Mild chronic posterior L4 vertebral body superior endplate compression 
deformity, with mild marrow edema. 
2.  L3-L4 annular tear, moderate/marked lateral recess stenosis (with 
encroachment on the bilateral L4 nerve roots) and moderate central canal/neural 
foraminal stenosis. 
3.  L4-L5 central disc protrusion, moderate lateral recess stenosis (with 
encroachment on the L5 nerve roots) and mild central canal/neural foraminal 
stenosis. 
4.  Multifocal degenerative change and mild stenosis at the remaining levels.

## 2020-07-29 IMAGING — MR MRI CERVICAL SPINE WITHOUT CONTRAST
7 series · 43 of 48 positions shown · IV contrast (gadolinium)
Comparison: None.

MRI CERVICAL SPINE WITHOUT CONTRAST, 07/29/2020 [DATE]: 
CLINICAL INDICATION: Neck pain radiating to the shoulders.
TECHNIQUE: Multiplanar, multiecho position MR images of the cervical spine were 
performed without intravenous gadolinium enhancement.

[Series 101: survey · axial · 10.0mm · 0.94mm/px · z∈[-15,+150]mm · 2 of 9 slices shown]
[im 1/9]
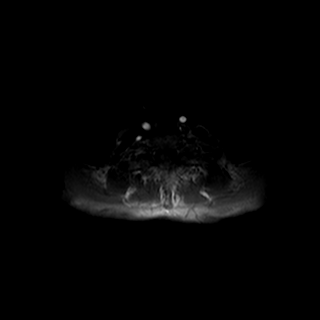
[im 9/9]
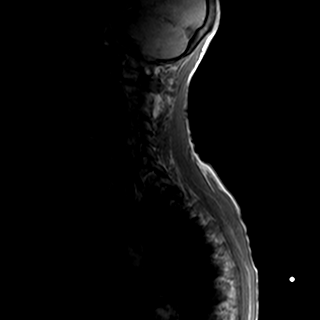

[Series 301: t1w_tse sag · sagittal · 3.0mm · 0.51mm/px · 5 of 15 slices shown]
[im 1/15]
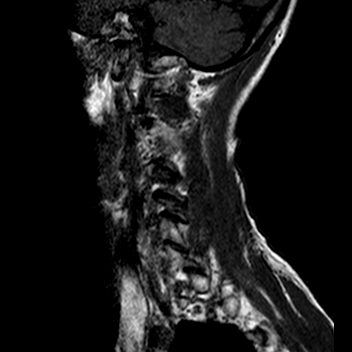
[im 4/15]
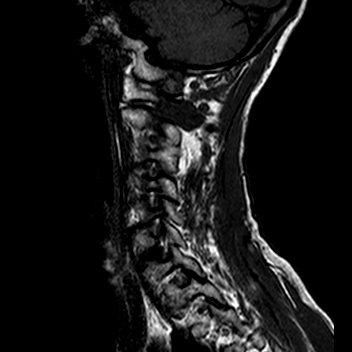
[im 8/15]
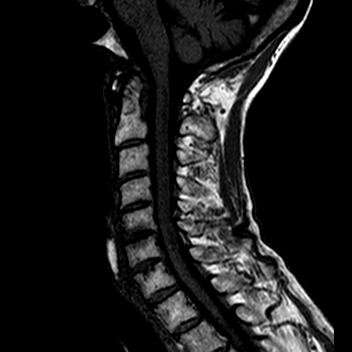
[im 11/15]
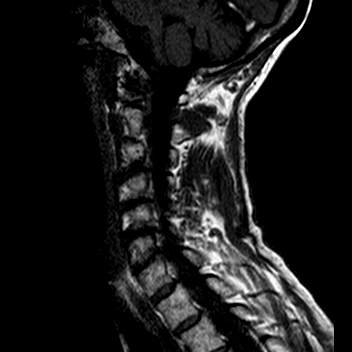
[im 15/15]
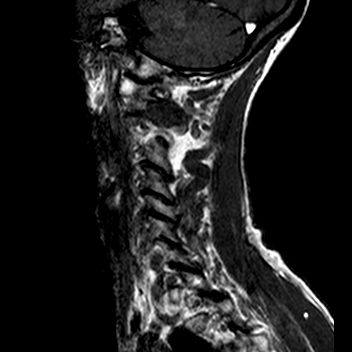

[Series 401: t2w_tse sag · sagittal · 3.0mm · 0.51mm/px · 5 of 15 slices shown]
[im 1/15]
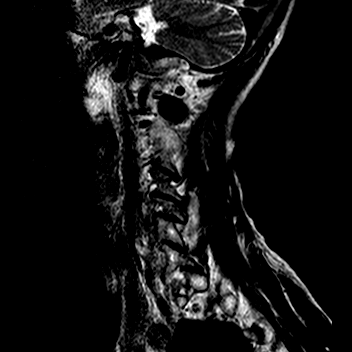
[im 4/15]
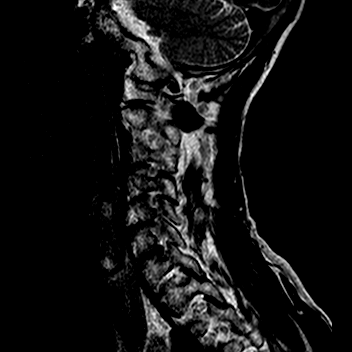
[im 8/15]
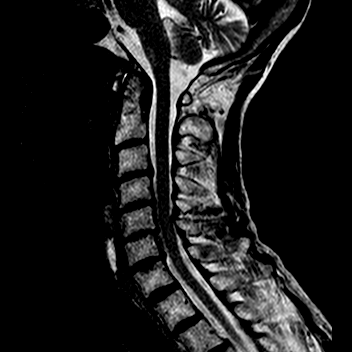
[im 11/15]
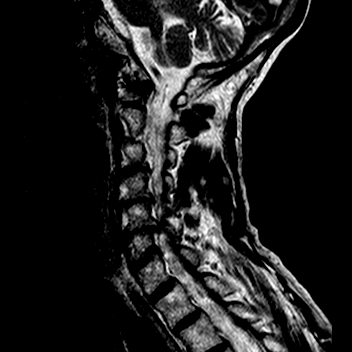
[im 15/15]
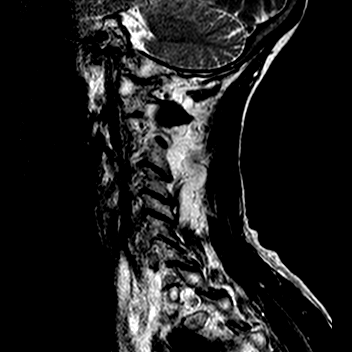

[Series 501: stir_longte sag · sagittal · 3.0mm · 0.62mm/px · 5 of 15 slices shown]
[im 1/15]
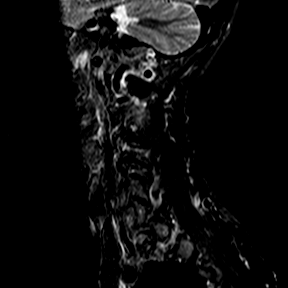
[im 4/15]
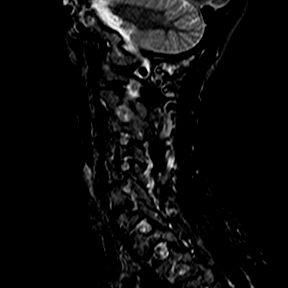
[im 8/15]
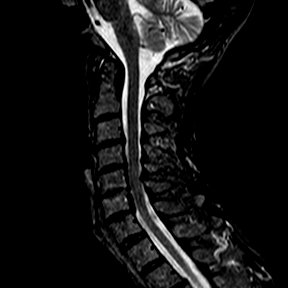
[im 11/15]
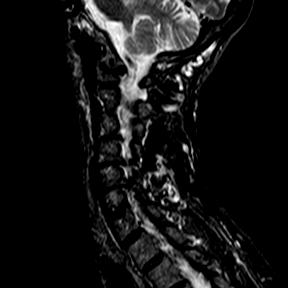
[im 15/15]
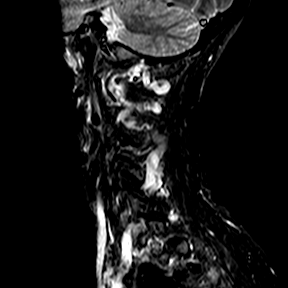

[Series 601: t2w_tse ax · axial · 3.0mm · 0.44mm/px · z∈[-46,+41]mm · 10 of 31 slices shown]
[im 1/31]
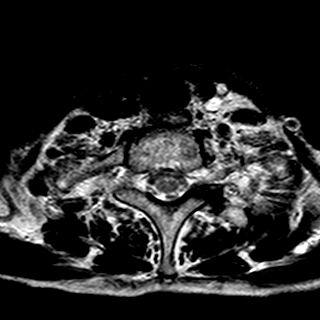
[im 4/31]
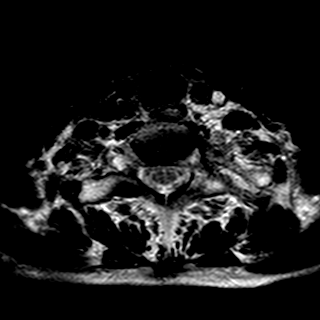
[im 7/31]
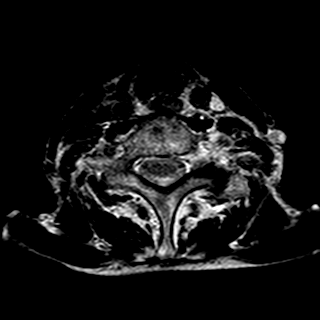
[im 11/31]
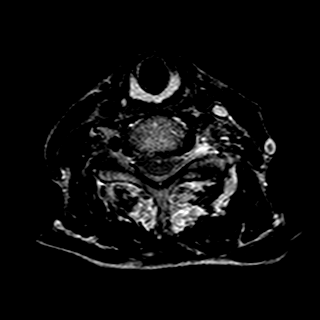
[im 14/31]
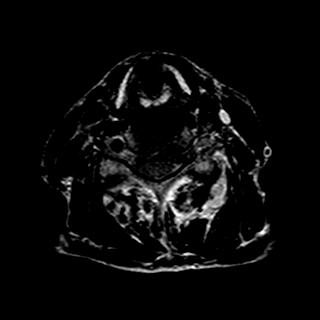
[im 17/31]
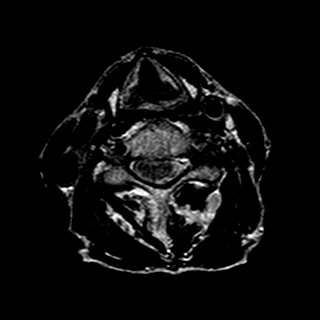
[im 21/31]
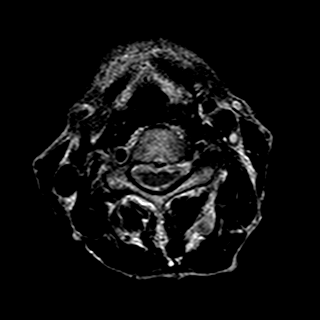
[im 24/31]
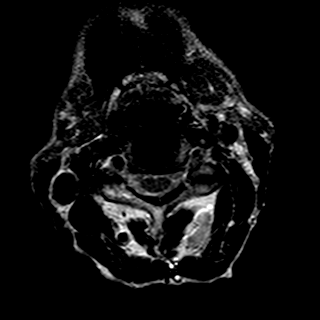
[im 27/31]
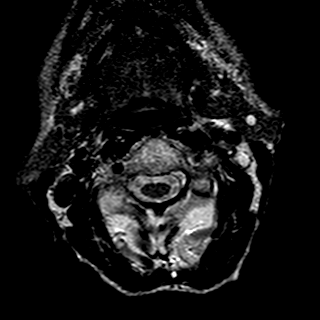
[im 31/31]
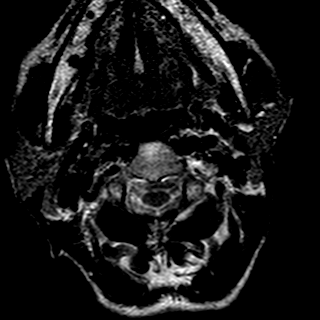

[Series 701: t2w_ffe_(id) · axial · 3.0mm · 0.44mm/px · z∈[-9,+42]mm · 8 of 36 slices shown (1 of 2)]
[im 1/36]
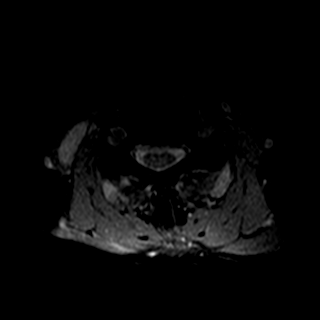
[im 8/36]
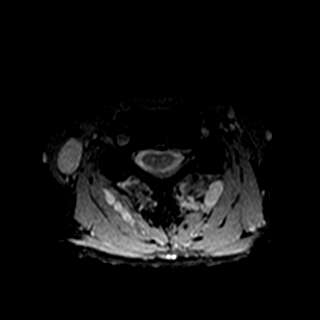
[im 11/36]
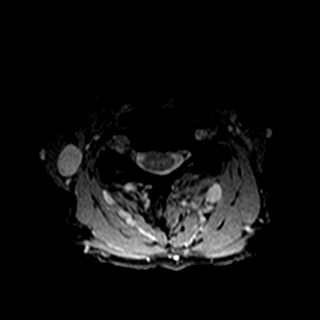
[im 15/36]
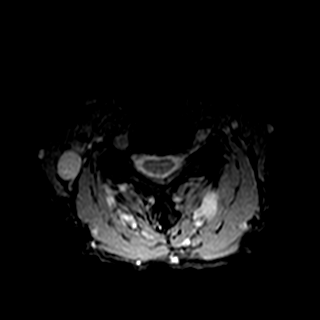
[im 22/36]
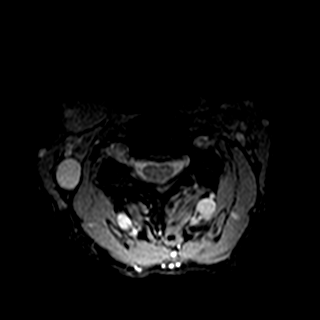
[im 25/36]
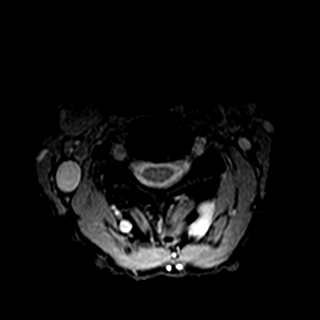
[im 29/36]
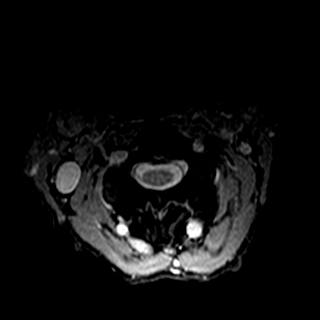
[im 36/36]
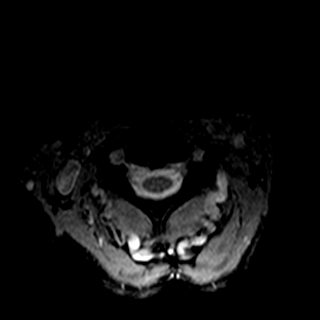

[Series 801: t2w_ffe_(id) · axial · 3.0mm · 0.44mm/px · z∈[-55,-14]mm · 8 of 32 slices shown (2 of 2)]
[im 1/32]
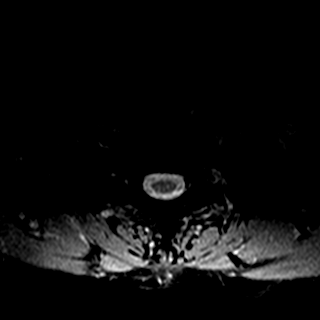
[im 4/32]
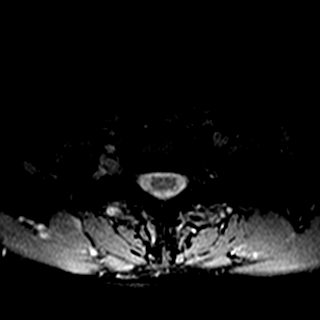
[im 11/32]
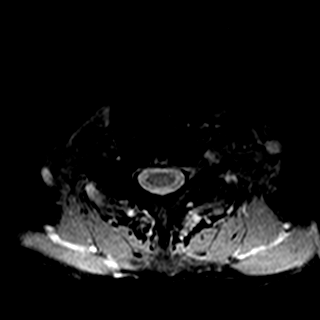
[im 14/32]
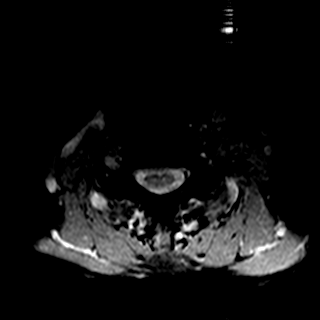
[im 18/32]
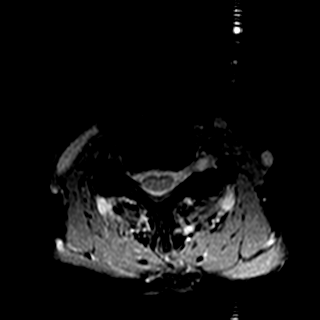
[im 21/32]
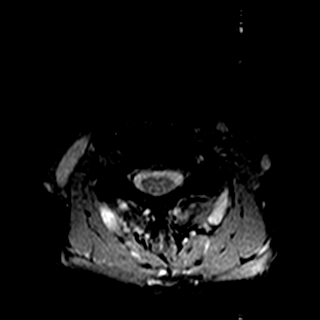
[im 28/32]
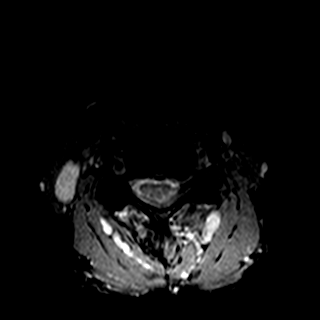
[im 32/32]
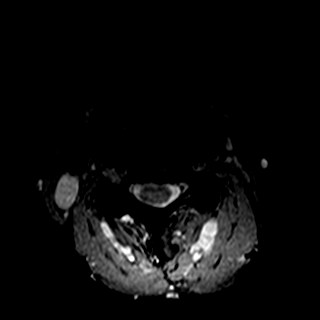

[43 of 48 positions shown; findings below may reference images not displayed]

FINDINGS: The vertebral bodies are normal in height and alignment. No vertebral 
body fracture. No spondylolisthesis. Normal bone marrow signal intensity. No 
signal abnormality or mass within the included portion of the spinal cord or 
spinal canal. The cerebellar tonsils are well located. The foramen magnum is 
negative. Included portions of the intracranial contents are negative. Posterior 
paraspinal soft tissues are negative. Left internal jugular vein appears to be 
occluded with venous collaterals posterior neck incidentally noted. 
C2-C3: The disc is normal in height and signal. No disc herniation. Mild 
degenerative change within the facet joints. No spinal canal or neural foraminal 
stenosis. 
C3-C4: Desiccation of the disc space. Broad-based bulging annulus. Mild/moderate 
degenerative change in the facet joints. Borderline canal stenosis. No 
significant foraminal stenosis.. 
C4-C5: Desiccation of the disc space. No significant focal protrusion of disc 
material. Broad-based bulging annulus. Moderate degenerative change within the 
facet joints. Mild canal stenosis. 
C5-C6: Desiccation of the disc space. No significant focal protrusion of disc 
material. Broad-based bulging annulus. Moderate degenerative change within the 
facet joints. Mild/moderate canal stenosis. No significant foraminal stenosis. 
C6-C7: Desiccation of the disc space. Broad-based bulging annulus. No 
significant focal protrusion of disc material. Mild/moderate general change in 
the facet joints. Mild canal stenosis. No significant foraminal stenosis. 
C7-T1: The disc is normal in height and signal. No disc herniation. Mild 
degenerative change within the facet joints. No spinal canal or neural foraminal 
stenosis.
IMPRESSION: No significant focal protrusion of disc material. Broad-based bulging annuli at 
C3-4, C4-5, C5-6 and C6-7. 
Mild to moderate spondylosis with borderline to moderate canal stenosis which is 
greatest at C5-6. 
No fracture.

## 2020-12-04 ENCOUNTER — Encounter: Admit: 2020-12-04 | Payer: PRIVATE HEALTH INSURANCE

## 2020-12-04 DIAGNOSIS — M542 Cervicalgia: Secondary | ICD-10-CM

## 2021-01-08 ENCOUNTER — Inpatient Hospital Stay: Admit: 2021-01-08 | Discharge: 2021-01-08 | Payer: PRIVATE HEALTH INSURANCE

## 2021-01-08 DIAGNOSIS — Z20822 Contact with and (suspected) exposure to covid-19: Secondary | ICD-10-CM

## 2021-01-08 DIAGNOSIS — Z20828 Contact with and (suspected) exposure to other viral communicable diseases: Secondary | ICD-10-CM

## 2021-01-09 LAB — COVID-19 CLEARANCE OR FOR PLACEMENT ONLY: BKR SARS-COV-2 RNA (COVID-19) (YH): NOT DETECTED

## 2021-01-10 ENCOUNTER — Encounter: Admit: 2021-01-10 | Payer: PRIVATE HEALTH INSURANCE

## 2021-01-10 ENCOUNTER — Inpatient Hospital Stay: Admit: 2021-01-10 | Discharge: 2021-01-10 | Payer: PRIVATE HEALTH INSURANCE | Attending: Emergency Medicine

## 2021-01-10 ENCOUNTER — Emergency Department: Admit: 2021-01-10 | Payer: PRIVATE HEALTH INSURANCE

## 2021-01-10 DIAGNOSIS — Z888 Allergy status to other drugs, medicaments and biological substances status: Secondary | ICD-10-CM

## 2021-01-10 DIAGNOSIS — Z85828 Personal history of other malignant neoplasm of skin: Secondary | ICD-10-CM

## 2021-01-10 DIAGNOSIS — I10 Essential (primary) hypertension: Secondary | ICD-10-CM

## 2021-01-10 DIAGNOSIS — E785 Hyperlipidemia, unspecified: Secondary | ICD-10-CM

## 2021-01-10 DIAGNOSIS — Z79890 Hormone replacement therapy (postmenopausal): Secondary | ICD-10-CM

## 2021-01-10 DIAGNOSIS — Z79899 Other long term (current) drug therapy: Secondary | ICD-10-CM

## 2021-01-10 DIAGNOSIS — C4490 Unspecified malignant neoplasm of skin, unspecified: Secondary | ICD-10-CM

## 2021-01-10 DIAGNOSIS — G43809 Other migraine, not intractable, without status migrainosus: Secondary | ICD-10-CM

## 2021-01-10 DIAGNOSIS — K589 Irritable bowel syndrome without diarrhea: Secondary | ICD-10-CM

## 2021-01-10 DIAGNOSIS — K5790 Diverticulosis of intestine, part unspecified, without perforation or abscess without bleeding: Secondary | ICD-10-CM

## 2021-01-10 DIAGNOSIS — E079 Disorder of thyroid, unspecified: Secondary | ICD-10-CM

## 2021-01-10 DIAGNOSIS — M79641 Pain in right hand: Secondary | ICD-10-CM

## 2021-01-10 DIAGNOSIS — Z885 Allergy status to narcotic agent status: Secondary | ICD-10-CM

## 2021-01-10 LAB — BASIC METABOLIC PANEL
BKR ANION GAP (LM): 6 mmol/L (ref 5–15)
BKR BLOOD UREA NITROGEN: 14 mg/dL (ref 7–18)
BKR CALCIUM: 8.7 mg/dL (ref 8.5–10.1)
BKR CHLORIDE: 103 mmol/L (ref 98–107)
BKR CO2: 28 mmol/L (ref 21–32)
BKR CREATININE: 0.49 mg/dL — ABNORMAL LOW (ref 0.55–1.02)
BKR EGFR, CREATININE (CKD-EPI 2021): >60 mL/min/{1.73_m2} (ref >=60)
BKR GLUCOSE: 92 mg/dL (ref 65–110)
BKR POTASSIUM: 3.9 mmol/L (ref 3.5–5.1)
BKR SODIUM: 137 mmol/L (ref 136–145)

## 2021-01-10 LAB — CBC WITH AUTO DIFFERENTIAL
BKR ANION GAP: 63.4 % (ref 7–17)
BKR WAM ABSOLUTE IMMATURE GRANULOCYTES.: 0.01 x 1000/ÂµL (ref 0.00–0.30)
BKR WAM ABSOLUTE LYMPHOCYTE COUNT.: 0.99 x 1000/ÂµL (ref 0.60–3.70)
BKR WAM ABSOLUTE NEUTROPHIL COUNT.: 2.54 x 1000/ÂµL (ref 2.00–7.60)
BKR WAM BASOPHIL ABSOLUTE COUNT.: 0.04 x 1000/ÂµL (ref 0.00–1.00)
BKR WAM BASOPHILS: 1 % (ref 0.0–1.4)
BKR WAM EOSINOPHIL ABSOLUTE COUNT.: 0.03 x 1000/ÂµL (ref 0.00–1.00)
BKR WAM EOSINOPHILS: 0.7 % (ref 0.0–5.0)
BKR WAM HEMATOCRIT (2 DEC): 34.1 % — ABNORMAL LOW (ref 35.00–45.00)
BKR WAM HEMOGLOBIN: 11.8 g/dL (ref 11.7–15.5)
BKR WAM IMMATURE GRANULOCYTES: 0.2 % (ref 0.0–1.0)
BKR WAM LYMPHOCYTES: 24.7 % (ref 17.0–50.0)
BKR WAM MCH PG: 35.4 pg — ABNORMAL HIGH (ref 27.0–33.0)
BKR WAM MCHC: 34.6 g/dL (ref 31.0–36.0)
BKR WAM MCV: 102.4 fL — ABNORMAL HIGH (ref 80.0–100.0)
BKR WAM MONOCYTE ABSOLUTE COUNT.: 0.4 x 1000/ÂµL (ref 0.00–1.00)
BKR WAM MONOCYTES: 10 % (ref 4.0–12.0)
BKR WAM MPV: 9.8 fL (ref 8.0–12.0)
BKR WAM NEUTROPHILS: 63.4 % (ref 39.0–72.0)
BKR WAM NUCLEATED RED BLOOD CELLS: 0 % (ref 0.0–1.0)
BKR WAM PLATELETS: 120 x1000/ÂµL — ABNORMAL LOW (ref 150–420)
BKR WAM RDW-CV: 10.9 % — ABNORMAL LOW (ref 11.0–15.0)
BKR WAM RED BLOOD CELL COUNT.: 3.33 M/ÂµL — ABNORMAL LOW (ref 4.00–6.00)
BKR WAM WHITE BLOOD CELL COUNT: 4 x1000/ÂµL (ref 4.0–11.0)

## 2021-01-10 MED ORDER — DIPHENHYDRAMINE 50 MG/ML INJECTION (WRAPPED E-RX)
50 mg/mL | Freq: Once | INTRAVENOUS | Status: CP
Start: 2021-01-10 — End: ?
  Administered 2021-01-10: 23:00:00 50 mL via INTRAVENOUS

## 2021-01-10 MED ORDER — PROCHLORPERAZINE EDISYLATE 10 MG/2 ML (5 MG/ML) INJECTION SOLUTION
10 mg/2 mL (5 mg/mL) | Freq: Once | INTRAVENOUS | Status: CP
Start: 2021-01-10 — End: ?
  Administered 2021-01-10: 23:00:00 10 mL via INTRAVENOUS

## 2021-01-10 NOTE — ED Notes
Pt d/c home in no acute distress. No questions at time of d/c. Aware to contact pcp regarding todays visit. Pt alert and oriented. Brought to exit by wheel chair. D/c home with daughter.

## 2021-01-10 NOTE — ED Provider Notes
HistoryChief Complaint Patient presents with ? Hypertension   Pt arrives to the ED from home by [facility] for headache, palpitations and high blood pressure. Pt does not have a hx of htn. States she was anxious about blood work results. Has hx of anxiety. Felt a headache and palpitations and took her BP at home. Called EMS for a BP of 186/71. Pt denies chest pain, shortness of breath. Does c/o headache and had recent covid exposure 1 week ago. Negative at home tests since.   HPI 75 year old female presents with headache and palpitations.  Patient states that she did develop a headache today and went to check her blood pressure.  Patient states her blood pressures in the 180s so she called an ambulance.  Patient denies any chest pain or shortness of breath.  Patient states that she has had headache for few days, does state she would a recent COVID exposure.  Denies any fevers or chills. Past Medical History: Diagnosis Date ? Bilateral hand pain 11/11/2017 ? Disease of thyroid gland  ? Diverticulosis  ? Hyperlipidemia  ? IBS (irritable bowel syndrome)  ? Skin cancer  Past Surgical History: Procedure Laterality Date ? CATARACT REMOVAL/IOL IMPLANT   ? COLONOSCOPY  11/25/2010  Diverticulosis of the sigmoid colon ? COLONOSCOPY  02/26/2015  Diverticulosis of the sigmoid colon. Polyps (3 mm to 4 mm) in the mid-ascending colon. (Polypectomy). Polyp (4 mm to 6 mm) in the distal ascending colon. (Polypectomy).  ? EGD/Colonoscopy  07/20/2005  Nonerosive antral gastritis; diverticulosis coli. Bx shows chronic non specific inflammation ? EGD/Colonoscopy  09/11/1999  Normal EGD; sigmoid diverticulosis.  ? ESOPHAGOGASTRODUODENOSCOPY  04/16/2009  Abnormally visible vascularity with absence of folds in the gastric body compatible with gastric mucosal atrophy. (Biopsy) ? MOLE REMOVAL  2006 ? OOPHORECTOMY  1961 ? SKIN CANCER EXCISION    R arm (2017) and R ankle (2003) ? THYROIDECTOMY  1969 Family History Problem Relation Age of Onset ? Diabetes Daughter  ? Breast cancer Maternal Aunt  ? Colon cancer Maternal Aunt  ? Colon cancer Maternal Grandfather  Social History Socioeconomic History ? Marital status: Married Tobacco Use ? Smoking status: Never Smoker ? Smokeless tobacco: Never Used Substance and Sexual Activity ? Alcohol use: Yes   Alcohol/week: 2.0 standard drinks   Types: 2 Glasses of wine per week ? Drug use: No ED Other Social History E-cigarette/Vaping Substances E-cigarette/Vaping Devices Review of Systems Constitutional: Negative for activity change, diaphoresis and fatigue. HENT: Negative for congestion and sinus pain.  Eyes: Negative for discharge. Respiratory: Negative for chest tightness and wheezing.  Cardiovascular: Negative for chest pain and palpitations. Gastrointestinal: Negative for abdominal distention, abdominal pain, nausea and vomiting. Endocrine: Negative for cold intolerance and heat intolerance. Musculoskeletal: Negative for arthralgias and joint swelling. Skin: Negative for color change. Neurological: Positive for headaches. Negative for dizziness and numbness. Hematological: Negative for adenopathy.  Physical ExamED Triage Vitals [01/10/21 1618]BP: (!) 161/85Pulse: 64Pulse from  O2 sat: n/aResp: 20Temp: 98.4 ?F (36.9 ?C)Temp src: OralSpO2: 97 % BP 126/72  - Pulse 60  - Temp 98.4 ?F (36.9 ?C) (Oral)  - Resp 16  - Ht 5' 7.5 (1.715 m)  - Wt 48.1 kg  - SpO2 97%  - BMI 16.36 kg/m? Physical ExamVitals reviewed. Constitutional:     Appearance: She is well-developed. HENT:    Head: Normocephalic and atraumatic.    Mouth/Throat:    Mouth: Mucous membranes are moist. Eyes:    Extraocular Movements: Extraocular movements intact.    Pupils: Pupils  are equal, round, and reactive to light. Cardiovascular: Rate and Rhythm: Normal rate and regular rhythm. Pulmonary:    Effort: Pulmonary effort is normal. No respiratory distress.    Breath sounds: Normal breath sounds. Abdominal:    General: Abdomen is flat. Bowel sounds are normal. There is no distension.    Palpations: Abdomen is soft.    Tenderness: There is no abdominal tenderness. There is no right CVA tenderness, left CVA tenderness, guarding or rebound. Musculoskeletal:       General: No swelling or tenderness. Normal range of motion.    Cervical back: No muscular tenderness. Skin:   General: Skin is warm and dry.    Capillary Refill: Capillary refill takes less than 2 seconds. Neurological:    General: No focal deficit present.    Mental Status: She is alert and oriented to person, place, and time.  ProceduresProcedures ED CourseClinical Impressions as of 01/10/21 1838 Other migraine without status migrainosus, not intractable Hypertension, unspecified type Patient presents with headache and hypertension.  On arrival patient was alert oriented x3, EKG did not show any signs acute ischemic changes.  Labs unremarkable, head [place] negative.  Patient has no chest pain, is well-appearing stable vital signs.  Stable for discharge home encouraged follow-up with primary care. ED DispositionDischarge Earlean Shawl, MD09/16/22 4782

## 2021-01-10 NOTE — ED Notes
6:47 PM pt does not want covid test

## 2021-04-23 ENCOUNTER — Encounter: Admit: 2021-04-23 | Payer: PRIVATE HEALTH INSURANCE | Attending: Hematology & Oncology

## 2021-04-23 DIAGNOSIS — E538 Deficiency of other specified B group vitamins: Secondary | ICD-10-CM

## 2021-06-05 IMAGING — CT CT ABDOMEN AND PELVIS WITH CONTRAST
2 of 3 series · 15 of 46 positions shown, 17 images · IV contrast (APPLIED)
Comparison: There are no prior exam(s) available for comparison within the past 
12 months; however, comparison was made to the prior exam(s) 09/17/2018 renal 
sonogram.

________________________________________________________________________________________________ 
CT ABDOMEN AND PELVIS WITH CONTRAST, 06/05/2021 [DATE]: 
A search for DICOM formatted images was conducted for prior CT imaging studies 
completed at a non-affiliated media free facility.   
CLINICAL INDICATION: Right upper quadrant pain
TECHNIQUE: The abdomen and pelvis was scanned from lung bases through the pubic 
rami with 100 mL of Isovue 300 on a high-resolution CT scanner using dose 
reduction techniques.  Routine MPR reconstructions were performed.

[Series 4: abd/pel ax w · axial · 0.68mm/px · z∈[-509,-140]mm · 12 of 143 slices shown, 14 images]
[im 10/143  soft-tissue]
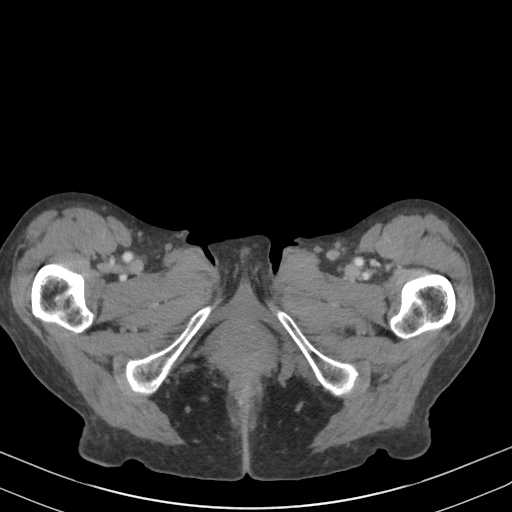
[im 10/143  bone]
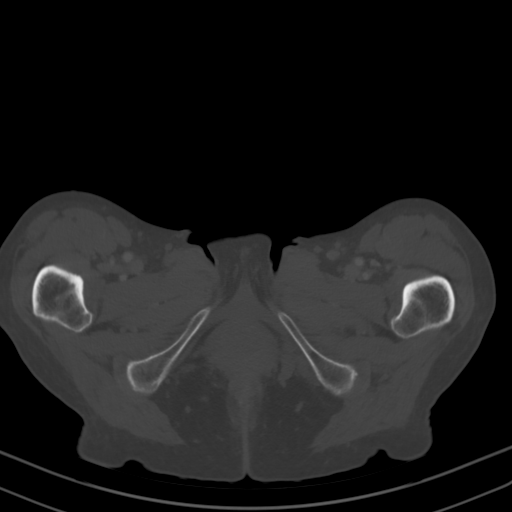
[im 19/143  soft-tissue]
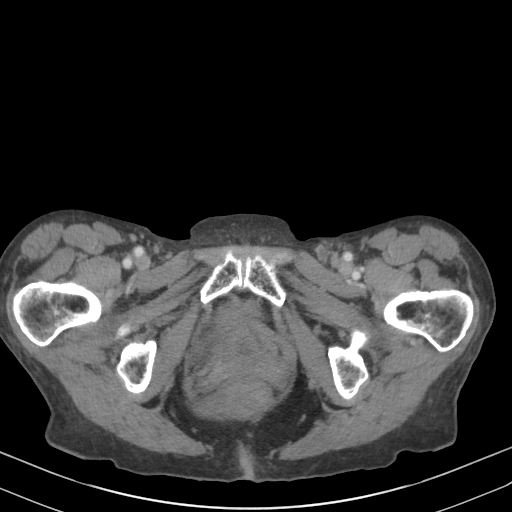
[im 33/143  soft-tissue]
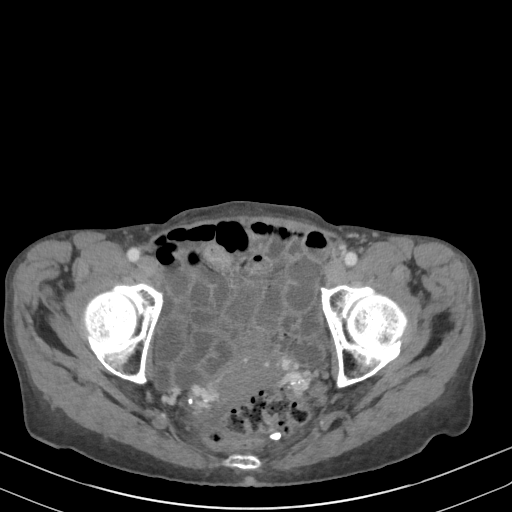
[im 42/143  soft-tissue]
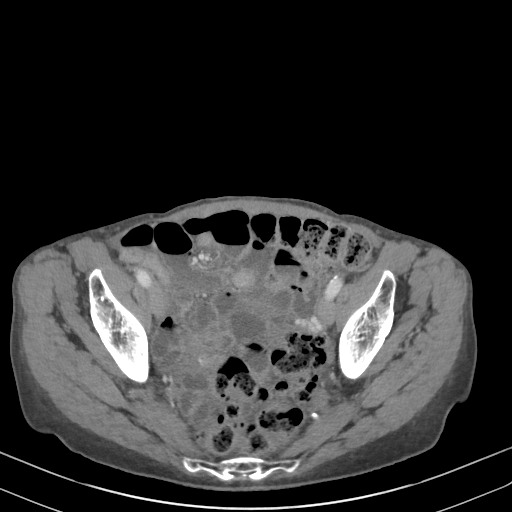
[im 55/143  soft-tissue]
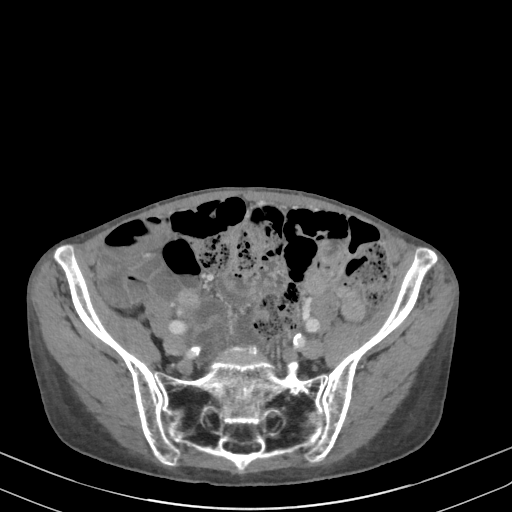
[im 65/143  soft-tissue]
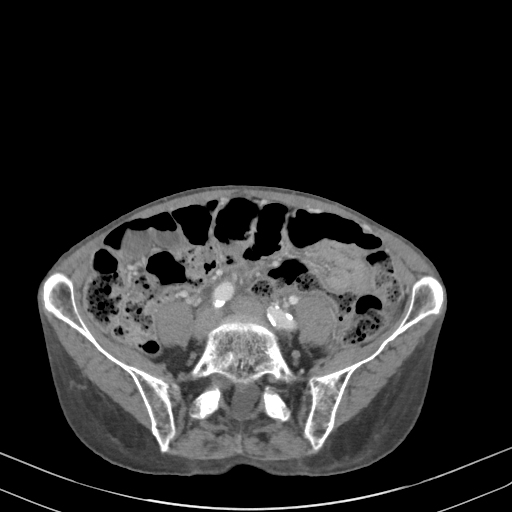
[im 78/143  soft-tissue]
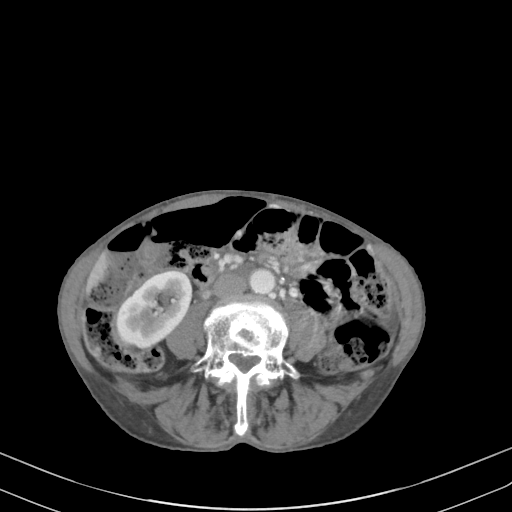
[im 88/143  soft-tissue]
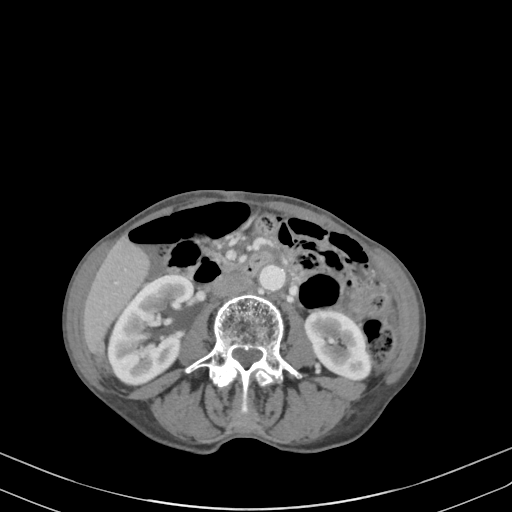
[im 101/143  soft-tissue]
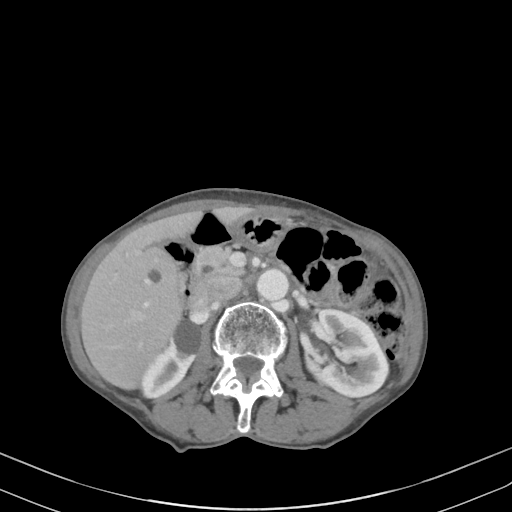
[im 101/143  bone]
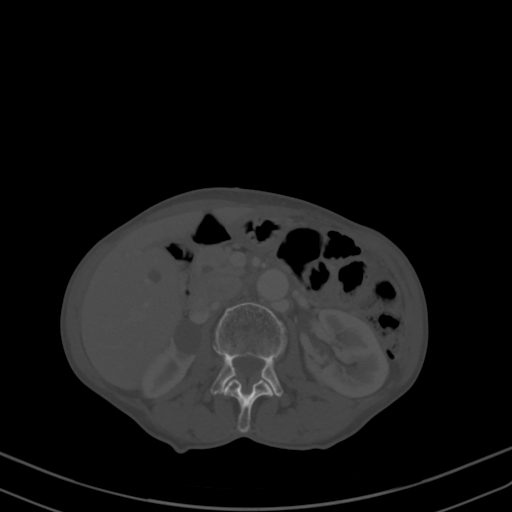
[im 110/143  soft-tissue]
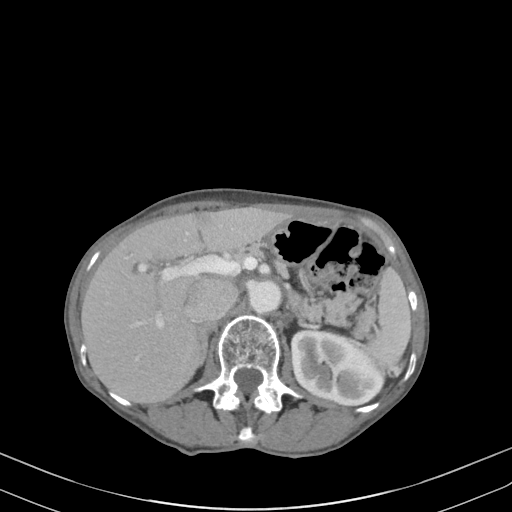
[im 124/143  soft-tissue]
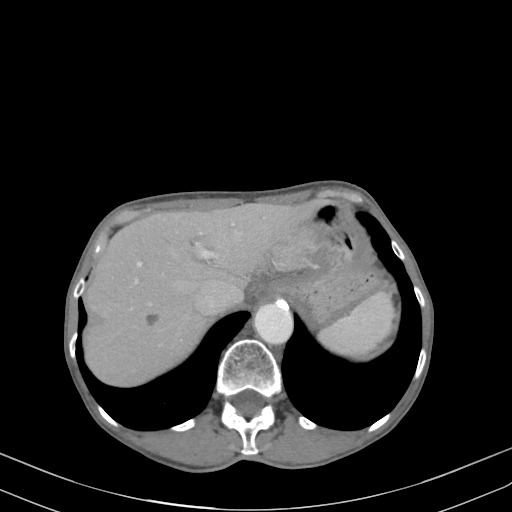
[im 133/143  soft-tissue]
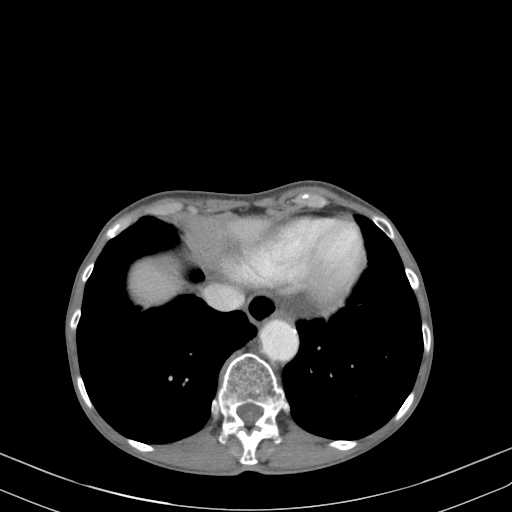

[Series 5: abd/pel cor w · coronal · 0.58mm/px · 3 of 106 slices shown]
[im 36/106  soft-tissue]
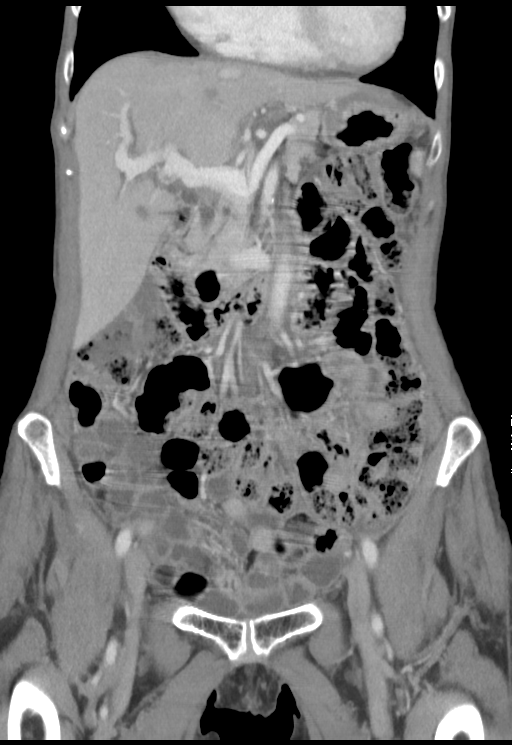
[im 47/106  soft-tissue]
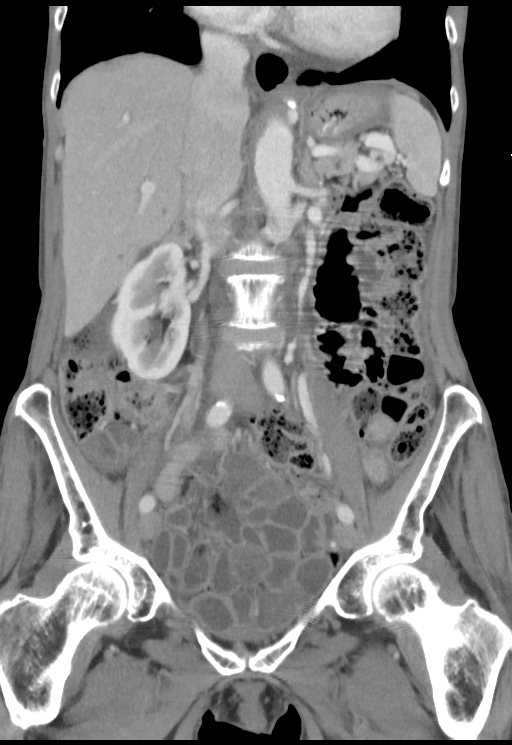
[im 59/106  soft-tissue]
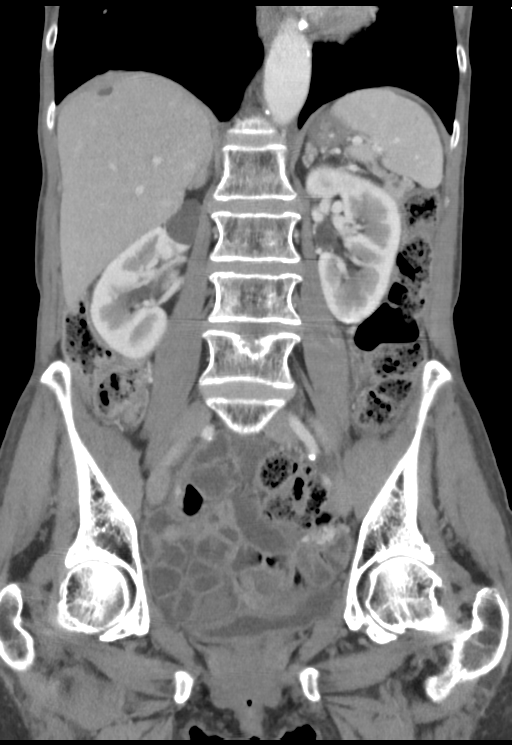

[15 of 46 positions shown; findings below may reference images not displayed]

FINDINGS: LUNG BASES/HEART: Lung bases are clear. No pleural effusions. Mild cardiomegaly. 
HEPATOBILIARY: No mass. Small focus of fatty infiltration of the liver adjacent 
to the falciform ligament. Hepatic cysts. Mild biliary prominence is attributed 
to cholecystectomy. 
SPLEEN: Normal in size. Splenule. 
PANCREAS: No ductal dilatation or mass.   
ADRENALS: No mass. 
GENITOURINARY: No enhancing mass or hydronephrosis.  Renal cysts. Bladder is 
unremarkable. 
LYMPH NODES: No adenopathy. 
STOMACH, SMALL BOWEL AND COLON: Stool throughout the colon. No bowel wall 
thickening or obstruction. 
VASCULAR STRUCTURES: No aneurysm. Atherosclerosis. Prominent parametrial 
vessels. 
MUSCULOSKELETAL: Chronic mild L4 superior endplate compression fracture and 
Schmorl node. Degenerative change, disc bulges, multilevel spinal stenosis and 
osteopenia.
IMPRESSION: 1.  Small focus of fatty infiltration of the liver adjacent to the falciform 
ligament.  
2.  Cholecystectomy. 
3.  Hepatic and renal cysts. 
4.  Mild cardiomegaly and atherosclerosis. 
5.  Chronic mild L4 compression fracture, degenerative change, spinal stenosis 
and osteopenia: DXA may be helpful for further evaluation.  
RADIATION DOSE REDUCTION: All CT scans are performed using radiation dose 
reduction techniques, when applicable.  Technical factors are evaluated and 
adjusted to ensure appropriate moderation of exposure.  Automated dose 
management technology is applied to adjust the radiation doses to minimize 
exposure while achieving diagnostic quality images.

## 2021-07-05 ENCOUNTER — Encounter: Admit: 2021-07-05 | Payer: PRIVATE HEALTH INSURANCE | Attending: Vascular and Interventional Radiology

## 2021-09-19 ENCOUNTER — Encounter: Admit: 2021-09-19 | Payer: PRIVATE HEALTH INSURANCE | Attending: Otolaryngology

## 2021-09-19 DIAGNOSIS — J329 Chronic sinusitis, unspecified: Secondary | ICD-10-CM

## 2021-09-19 DIAGNOSIS — J342 Deviated nasal septum: Secondary | ICD-10-CM

## 2021-10-01 ENCOUNTER — Inpatient Hospital Stay: Admit: 2021-10-01 | Discharge: 2021-10-01 | Payer: PRIVATE HEALTH INSURANCE

## 2021-10-01 DIAGNOSIS — J329 Chronic sinusitis, unspecified: Secondary | ICD-10-CM

## 2021-10-01 DIAGNOSIS — J342 Deviated nasal septum: Secondary | ICD-10-CM

## 2023-02-05 ENCOUNTER — Inpatient Hospital Stay: Admit: 2023-02-05 | Discharge: 2023-02-05 | Payer: PRIVATE HEALTH INSURANCE

## 2023-02-05 ENCOUNTER — Encounter: Admit: 2023-02-05 | Payer: PRIVATE HEALTH INSURANCE | Attending: Cardiovascular Disease

## 2023-02-08 ENCOUNTER — Encounter: Admit: 2023-02-08 | Payer: PRIVATE HEALTH INSURANCE | Attending: Cardiovascular Disease

## 2023-02-08 DIAGNOSIS — E079 Disorder of thyroid, unspecified: Secondary | ICD-10-CM

## 2023-02-08 DIAGNOSIS — K5790 Diverticulosis of intestine, part unspecified, without perforation or abscess without bleeding: Secondary | ICD-10-CM

## 2023-02-08 DIAGNOSIS — I341 Nonrheumatic mitral (valve) prolapse: Secondary | ICD-10-CM

## 2023-02-08 DIAGNOSIS — I361 Nonrheumatic tricuspid (valve) insufficiency: Secondary | ICD-10-CM

## 2023-02-08 DIAGNOSIS — K589 Irritable bowel syndrome without diarrhea: Secondary | ICD-10-CM

## 2023-02-08 DIAGNOSIS — C4490 Unspecified malignant neoplasm of skin, unspecified: Secondary | ICD-10-CM

## 2023-02-08 DIAGNOSIS — E785 Hyperlipidemia, unspecified: Secondary | ICD-10-CM

## 2023-02-08 DIAGNOSIS — I340 Nonrheumatic mitral (valve) insufficiency: Secondary | ICD-10-CM

## 2023-02-08 DIAGNOSIS — M79641 Pain in right hand: Secondary | ICD-10-CM

## 2023-02-08 MED ORDER — MIDODRINE 2.5 MG TABLET
2.5 | Freq: Two times a day (BID) | ORAL | Status: AC
Start: 2023-02-08 — End: ?

## 2023-02-08 MED ORDER — CYANOCOBALAMIN (VIT B-12) 1,000 MCG TABLET
1000 | Freq: Every day | ORAL | Status: AC
Start: 2023-02-08 — End: ?

## 2023-02-08 MED ORDER — PROLIA SUBQ
SUBCUTANEOUS | Status: AC
Start: 2023-02-08 — End: ?

## 2023-02-09 ENCOUNTER — Telehealth: Admit: 2023-02-09 | Payer: PRIVATE HEALTH INSURANCE | Attending: Cardiovascular Disease

## 2023-02-10 NOTE — Progress Notes
  [facility]  Patient Consultation History and Physical ExamDate of Encounter: 10/14/2024Reason for Consult:  valvular heart diseaseConsult Requested By: Dr. Maia Petties and Plan: 77 year old female with moderate to severe mitral regurgitation referred for evaluation of advanced valve therapies. In a shared decision making fashion I had an extensive discussion with the patient regarding the natural history of mitral regurgitation and the different approaches which could be taken for the management of this disease process.  We discussed in detail both the natural history of the disease, the progression of the disease, potential interventions, and reviewed in detail the options of medical therapy, surgical valve intervention, and transcatheter valve intervention.  I discussed with the patient that in the setting of her mild symptoms without overt heart failure and normal LV function, it would be reasonable to continue with close follow-up although I would like to present her case at our multidisciplinary mitral valve conference. The patient notes that she would be interested in potentially proceeding with intervention if at felt it would be beneficial for her.  I discussed with the patient that given that her last echo was performed in January, I would like to repeat this to understand if there have been any changes in her LV function or in her mitral regurgitation as that might lead [place] 1 way or another.  The patient expressed understanding in this.  We will plan on obtaining a transthoracic echo and thereafter will have her case presented at our multidisciplinary mitral conference.Subjective: I had the opportunity to see Debra [name] in our cardiology clinic today.  Debra [name] is a 77 y.o.female who presents today for new patient consultation.Debra [name] has a past medical history of valvular heart disease, Bilateral hand pain (11/11/2017), Disease of thyroid gland, Diverticulosis, Hyperlipidemia, IBS (irritable bowel syndrome), and Skin cancer.History of Present Illness:  Debra [name] is a 77 year old female with past medical history significant for hypertension, systemic lupus, ulcerative colitis, and mitral valve prolapse with associated significant mitral regurgitation. Per chart review she was evaluated for her mitral regurgitation in January of this year at [facility] at which time noted longstanding mitral valve prolapse, but on her most recent echocardiogram, she was found to have preserved left ventricular systolic function, but severe mitral regurgitation from bileaflet prolapse.  She clearly had myxomatous leaflets of the mitral valve.  She also has at least moderate if not severe tricuspid regurgitation with myxomatous tricuspid leaflets.  Her RV was slightly dilated, but overall the RV function appeared preserved, and there was no evidence of pulmonary hypertension.At that time she felt no limiting symptoms with her current activities, although she admits she does not perform vigorous exercise but does participate in Silver sneakers at her local [employer] 3 days a week. She ultimately underwent transesophageal echo which showed moderate to severe MR although unfortunately I am unable to view the raw images.On visit today patient reports she is doing relatively well from a cardiovascular standpoint although notes some mild fatigue.  She denies any orthopnea, chest pain, or syncope/presyncope.  She also notes chronic neck and back pain.  She reports doing physical therapy for these orthopedic issues which includes mild weight exercises without significant symptoms other than some fatigue which is fairly new for her, and reports she is able to walk for about 30 minutes a day.Review of Systems: pertinent positives and negatives as per history of present illnessThe remainder of the 12 point review of systems was reviewed with the patient and is  negative.Medical History: PMH PSH  She    has a past medical history of Bilateral hand pain (11/11/2017), Disease of thyroid gland, Diverticulosis, Hyperlipidemia, IBS (irritable bowel syndrome), and Skin cancer. She  has a past surgical history that includes Mole removal (2006); Thyroidectomy (1969); Oophorectomy (1961); Colonoscopy (11/25/2010); Esophagogastroduodenoscopy (04/16/2009); EGD/Colonoscopy (07/20/2005); EGD/Colonoscopy (09/11/1999); Colonoscopy (02/26/2015); Cataract Removal/IOL Implant; and Skin cancer excision. Social History Family History She  reports that she has never smoked. She has never used smokeless tobacco. She reports current alcohol use of about 2.0 standard drinks of alcohol per week. She reports that she does not use drugs. Family History Problem Relation Age of Onset  Diabetes Daughter   Breast cancer Maternal Aunt   Colon cancer Maternal Aunt   Colon cancer Maternal Grandfather   Medications Current Outpatient Medications Medication Sig Dispense Refill  acetaminophen (TYLENOL) 500 MG tablet Take 1,000 mg by mouth every 6 (six) hours as needed..    calcium carbonate-vitamin D3 (OSCAL) 250-125 mg-unit per tablet Take by mouth.    cholecalciferol (VITAMIN D) 1,000 unit tablet Take 1,000 Units by mouth.    citalopram (CELEXA) 20 MG tablet Take 10 mg by mouth daily   0  glucosamine-chondroitin 500-400 mg tablet Take 1 tablet by mouth daily    hydroxychloroquine (PLAQUENIL) 200 mg tablet TAKE 1 TABLET TWICE A DAY  0  iron 18 mg Tab Take 25 mg by mouth 2 (two) times daily.Marland Kitchen     levothyroxine (SYNTHROID, LEVOTHROID) 112 MCG tablet Take 112 mcg by mouth. 112 mcg 4 timers per week and 100 mcg 3 times per week per patient    minocycline (MINOCIN,DYNACIN) 100 MG capsule TAKE ONE PILL TWICE DAILY WITH A FULL GLASS OF WATER. DO NOT LIE DOWN 1 HOUR AFTER TAKING. (Patient not taking: Reported on 01/10/2021)  3  multivitamin capsule Take 1 capsule by mouth daily..    pantoprazole (PROTONIX) 40 MG tablet Take 1 tablet (40 mg total) by mouth 2 (two) times daily (0800, 1800).. (Patient taking differently: Take 40 mg by mouth daily.) 60 tablet 0  sucralfate (CARAFATE) 1 gram tablet Take 1 g by mouth.    syringe with needle (BD LUER-LOK SYRINGE) 3 mL 25 x 5/8 Use as directed with B12 6 each 1 No current facility-administered medications for this visit.  Allergies She is allergic to niacin preparations, tramadol, and cyclobenzaprine. Objective: Vital Signs:Ht 5' 7 (1.702 m)  - BMI 16.60 kg/m? Wt Readings from Last 3 Encounters: 01/10/21 48.1 kg 11/04/19 50.5 kg 03/02/17 54 kg Physical Exam: General Appearance:  Pleasant, NAD, ambulates without assitance  HEENT :  Anicteric, MMM, o/p clear  Neck :  Supple, JVP <6cm, no carotid bruits   CV :  S1 S2, RRR, 2/6 systolic murmur Pulm:  Clear bilaterally, no rales, no wheezes  Abdomen:   Soft, non-tender, non-distended, no guarding, no rebound, +bowel sounds  Extremities: Warm and well perfused, no edema, no cyanosis  Pulses: 2+ and symmetric all extremities, femoral pulses 2+ bilaterally, diminished pedal pulses  Neuro : Equal strength bilaterally, no focal deficits.   LABS:Lipids:No results found for: CHOLNo results found for: HDLNo components found for: LDLCALCNo results found for: TRIGNo results found for: CHOLHDLChemistry:Lab Results Component Value Date  CREATININE 0.49 (L) 01/10/2021  BUN 14 01/10/2021  NA 137 01/10/2021  K 3.9 01/10/2021  CL 103 01/10/2021  CO2 28 01/10/2021 Lab Results Component Value Date  ALT 29 05/25/2016  AST 22 05/25/2016  ALKPHOS 66 05/25/2016  BILITOT 0.4 05/25/2016 Hematology:Lab Results  Component Value Date  WBC 4.0 01/10/2021  HGB 11.8 01/10/2021  HCT 34.10 (L) 01/10/2021  MCV 102.4 (H) 01/10/2021  PLT 120 (L) 01/10/2021 Lab Results Component Value Date  PTT 31 01/03/2015 Lab Results Component Value Date  INR 1.01 01/03/2015 TEE:1/2024Normal left ventricular size and systolic function. Right ventricle is moderately dilated with normal systolic function. 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 Severe biatrial enlargement. No thrombus seen in the left atrium or left atrial appendage. NYHA Class: 1-2ECG: sinus rhythm at 65 beats per minute with PACsSigned:Anari Evitt K. Safira Proffit, MDAssociate Professor of Medicine (Cardiology)Director of Interventional Cardiology Director of Structural Heart [facility]

## 2023-02-11 ENCOUNTER — Encounter: Admit: 2023-02-11 | Payer: PRIVATE HEALTH INSURANCE | Attending: Cardiovascular Disease

## 2023-02-11 ENCOUNTER — Ambulatory Visit: Admit: 2023-02-11 | Payer: PRIVATE HEALTH INSURANCE | Attending: Cardiovascular Disease

## 2023-02-22 ENCOUNTER — Ambulatory Visit: Admit: 2023-02-22 | Payer: PRIVATE HEALTH INSURANCE

## 2023-03-03 ENCOUNTER — Inpatient Hospital Stay: Admit: 2023-03-03 | Discharge: 2023-03-03 | Payer: PRIVATE HEALTH INSURANCE

## 2023-03-03 DIAGNOSIS — I340 Nonrheumatic mitral (valve) insufficiency: Secondary | ICD-10-CM

## 2023-03-03 MED ORDER — PERFLUTREN LIPID MICROSPHERES 1.3 ML/10 ML NS INJ (IN
Freq: Once | INTRAVENOUS | Status: AC | PRN
Start: 2023-03-03 — End: 2023-03-08

## 2023-03-06 ENCOUNTER — Encounter: Admit: 2023-03-06 | Payer: PRIVATE HEALTH INSURANCE | Attending: Cardiovascular Disease

## 2023-03-08 ENCOUNTER — Encounter: Admit: 2023-03-08 | Payer: PRIVATE HEALTH INSURANCE | Attending: Cardiovascular Disease

## 2023-03-11 ENCOUNTER — Ambulatory Visit: Admit: 2023-03-11 | Payer: PRIVATE HEALTH INSURANCE | Attending: Cardiovascular Disease

## 2023-03-11 ENCOUNTER — Encounter: Admit: 2023-03-11 | Payer: PRIVATE HEALTH INSURANCE | Attending: Cardiovascular Disease

## 2023-03-11 ENCOUNTER — Inpatient Hospital Stay: Admit: 2023-03-11 | Discharge: 2023-03-11 | Payer: PRIVATE HEALTH INSURANCE

## 2023-03-11 ENCOUNTER — Ambulatory Visit: Admit: 2023-03-11 | Payer: PRIVATE HEALTH INSURANCE | Attending: Cardiothoracic Surgery

## 2023-03-11 VITALS — BP 124/72 | HR 70 | Temp 97.80000°F | Wt 103.0 lb

## 2023-03-11 DIAGNOSIS — C4490 Unspecified malignant neoplasm of skin, unspecified: Secondary | ICD-10-CM

## 2023-03-11 DIAGNOSIS — E079 Disorder of thyroid, unspecified: Secondary | ICD-10-CM

## 2023-03-11 DIAGNOSIS — I341 Nonrheumatic mitral (valve) prolapse: Secondary | ICD-10-CM

## 2023-03-11 DIAGNOSIS — E785 Hyperlipidemia, unspecified: Secondary | ICD-10-CM

## 2023-03-11 DIAGNOSIS — I340 Nonrheumatic mitral (valve) insufficiency: Secondary | ICD-10-CM

## 2023-03-11 DIAGNOSIS — K5790 Diverticulosis of intestine, part unspecified, without perforation or abscess without bleeding: Secondary | ICD-10-CM

## 2023-03-11 DIAGNOSIS — M79641 Pain in right hand: Secondary | ICD-10-CM

## 2023-03-11 DIAGNOSIS — K589 Irritable bowel syndrome without diarrhea: Secondary | ICD-10-CM

## 2023-03-11 LAB — NT-PROBNPE: BKR B-TYPE NATRIURETIC PEPTIDE, PRO (PROBNP): 198 pg/mL (ref <450.0)

## 2023-03-15 ENCOUNTER — Encounter: Admit: 2023-03-15 | Payer: PRIVATE HEALTH INSURANCE | Attending: Cardiovascular Disease

## 2023-03-15 ENCOUNTER — Telehealth: Admit: 2023-03-15 | Payer: PRIVATE HEALTH INSURANCE | Attending: Cardiovascular Disease

## 2023-03-17 ENCOUNTER — Inpatient Hospital Stay: Admit: 2023-03-17 | Discharge: 2023-03-17 | Payer: PRIVATE HEALTH INSURANCE

## 2023-03-17 ENCOUNTER — Encounter: Admit: 2023-03-17 | Payer: PRIVATE HEALTH INSURANCE | Attending: Cardiovascular Disease

## 2023-03-17 ENCOUNTER — Encounter: Admit: 2023-03-17 | Payer: PRIVATE HEALTH INSURANCE

## 2023-03-17 DIAGNOSIS — I340 Nonrheumatic mitral (valve) insufficiency: Secondary | ICD-10-CM

## 2023-03-17 DIAGNOSIS — D649 Anemia, unspecified: Secondary | ICD-10-CM

## 2023-03-17 DIAGNOSIS — I361 Nonrheumatic tricuspid (valve) insufficiency: Secondary | ICD-10-CM

## 2023-03-17 DIAGNOSIS — K589 Irritable bowel syndrome without diarrhea: Secondary | ICD-10-CM

## 2023-03-17 DIAGNOSIS — M79641 Pain in right hand: Secondary | ICD-10-CM

## 2023-03-17 DIAGNOSIS — C4490 Unspecified malignant neoplasm of skin, unspecified: Secondary | ICD-10-CM

## 2023-03-17 DIAGNOSIS — I341 Nonrheumatic mitral (valve) prolapse: Secondary | ICD-10-CM

## 2023-03-17 DIAGNOSIS — K5790 Diverticulosis of intestine, part unspecified, without perforation or abscess without bleeding: Secondary | ICD-10-CM

## 2023-03-17 DIAGNOSIS — K219 Gastro-esophageal reflux disease without esophagitis: Secondary | ICD-10-CM

## 2023-03-17 DIAGNOSIS — E785 Hyperlipidemia, unspecified: Secondary | ICD-10-CM

## 2023-03-17 DIAGNOSIS — Z789 Other specified health status: Secondary | ICD-10-CM

## 2023-03-17 DIAGNOSIS — E079 Disorder of thyroid, unspecified: Secondary | ICD-10-CM

## 2023-03-17 LAB — CBC WITH AUTO DIFFERENTIAL
BKR WAM ABSOLUTE IMMATURE GRANULOCYTES.: 0.02 x 1000/ÂµL (ref 0.00–0.30)
BKR WAM ABSOLUTE LYMPHOCYTE COUNT.: 1 x 1000/ÂµL (ref 0.60–3.70)
BKR WAM ABSOLUTE NRBC (2 DEC): 0 x 1000/ÂµL (ref 0.00–1.00)
BKR WAM ANC (ABSOLUTE NEUTROPHIL COUNT): 2.11 x 1000/ÂµL (ref 2.00–7.60)
BKR WAM BASOPHIL ABSOLUTE COUNT.: 0.06 x 1000/ÂµL (ref 0.00–1.00)
BKR WAM BASOPHILS: 1.7 % — ABNORMAL HIGH (ref 0.0–1.4)
BKR WAM EOSINOPHIL ABSOLUTE COUNT.: 0.03 x 1000/ÂµL (ref 0.00–1.00)
BKR WAM EOSINOPHILS: 0.8 % (ref 0.0–5.0)
BKR WAM HEMATOCRIT (2 DEC): 37.9 % (ref 35.00–45.00)
BKR WAM HEMOGLOBIN: 12.8 g/dL (ref 11.7–15.5)
BKR WAM IMMATURE GRANULOCYTES: 0.6 % (ref 0.0–1.0)
BKR WAM LYMPHOCYTES: 27.7 % (ref 17.0–50.0)
BKR WAM MCH (PG): 34.6 pg — ABNORMAL HIGH (ref 27.0–33.0)
BKR WAM MCHC: 33.8 g/dL (ref 31.0–36.0)
BKR WAM MCV: 102.4 fL — ABNORMAL HIGH (ref 80.0–100.0)
BKR WAM MONOCYTE ABSOLUTE COUNT.: 0.39 x 1000/ÂµL (ref 0.00–1.00)
BKR WAM MONOCYTES: 10.8 % (ref 4.0–12.0)
BKR WAM MPV: 10 fL (ref 8.0–12.0)
BKR WAM NEUTROPHILS: 58.4 % (ref 39.0–72.0)
BKR WAM NUCLEATED RED BLOOD CELLS: 0 % (ref 0.0–1.0)
BKR WAM PLATELETS: 142 x1000/ÂµL — ABNORMAL LOW (ref 150–420)
BKR WAM RDW-CV: 11.7 % (ref 11.0–15.0)
BKR WAM RED BLOOD CELL COUNT.: 3.7 M/ÂµL — ABNORMAL LOW (ref 4.00–6.00)
BKR WAM WHITE BLOOD CELL COUNT: 3.6 x1000/ÂµL — ABNORMAL LOW (ref 4.0–11.0)

## 2023-03-17 LAB — BASIC METABOLIC PANEL
BKR ANION GAP (LM): 5 mmol/L (ref 5–15)
BKR BLOOD UREA NITROGEN: 14 mg/dL (ref 7–18)
BKR CALCIUM: 8.8 mg/dL (ref 8.5–10.1)
BKR CHLORIDE: 103 mmol/L (ref 98–107)
BKR CO2: 29 mmol/L (ref 21–32)
BKR CREATININE: 0.59 mg/dL (ref 0.55–1.02)
BKR EGFR, CREATININE (CKD-EPI 2021): >60 mL/min/{1.73_m2} (ref >=60)
BKR GLUCOSE: 113 mg/dL — ABNORMAL HIGH (ref 65–110)
BKR POTASSIUM: 4 mmol/L (ref 3.5–5.1)
BKR SODIUM: 137 mmol/L (ref 136–145)

## 2023-03-17 MED ORDER — GABAPENTIN 300 MG CAPSULE
300 | Freq: Four times a day (QID) | ORAL | Status: AC
Start: 2023-03-17 — End: ?

## 2023-03-17 NOTE — Other
Pt informed

## 2023-03-17 NOTE — Other
Please let patient know that her labs are stable.

## 2023-03-17 NOTE — Other
 Called pt no answer LVM to give the office a call back

## 2023-03-17 NOTE — Plan of Care
 Plan of Care Overview/ Patient Status  PAT completed. Patient instructed no solid food after MN Sunday night. May have clear liquids up until 2 hours prior to arrival. Will arrange for ride home. May take usual meds with sip of water.

## 2023-03-19 ENCOUNTER — Encounter: Admit: 2023-03-19 | Payer: PRIVATE HEALTH INSURANCE

## 2023-03-19 DIAGNOSIS — N3941 Urge incontinence: Secondary | ICD-10-CM

## 2023-03-22 ENCOUNTER — Encounter: Admit: 2023-03-22 | Payer: PRIVATE HEALTH INSURANCE | Attending: Cardiovascular Disease

## 2023-03-22 ENCOUNTER — Inpatient Hospital Stay: Admit: 2023-03-22 | Discharge: 2023-03-22 | Payer: PRIVATE HEALTH INSURANCE | Attending: Cardiovascular Disease

## 2023-03-22 DIAGNOSIS — C4490 Unspecified malignant neoplasm of skin, unspecified: Secondary | ICD-10-CM

## 2023-03-22 DIAGNOSIS — D649 Anemia, unspecified: Secondary | ICD-10-CM

## 2023-03-22 DIAGNOSIS — I340 Nonrheumatic mitral (valve) insufficiency: Secondary | ICD-10-CM

## 2023-03-22 DIAGNOSIS — K5790 Diverticulosis of intestine, part unspecified, without perforation or abscess without bleeding: Secondary | ICD-10-CM

## 2023-03-22 DIAGNOSIS — M329 Systemic lupus erythematosus, unspecified: Secondary | ICD-10-CM

## 2023-03-22 DIAGNOSIS — E079 Disorder of thyroid, unspecified: Secondary | ICD-10-CM

## 2023-03-22 DIAGNOSIS — K589 Irritable bowel syndrome without diarrhea: Secondary | ICD-10-CM

## 2023-03-22 DIAGNOSIS — K219 Gastro-esophageal reflux disease without esophagitis: Secondary | ICD-10-CM

## 2023-03-22 DIAGNOSIS — M79641 Pain in right hand: Secondary | ICD-10-CM

## 2023-03-22 DIAGNOSIS — I38 Endocarditis, valve unspecified: Secondary | ICD-10-CM

## 2023-03-22 DIAGNOSIS — E785 Hyperlipidemia, unspecified: Secondary | ICD-10-CM

## 2023-03-22 MED ORDER — HEPARIN (PORCINE) 1,000 UNIT/ML INJECTION SOLUTION
1000 | Status: CP
Start: 2023-03-22 — End: ?

## 2023-03-22 MED ORDER — SODIUM CHLORIDE 0.9 % (FLUSH) INJECTION SYRINGE
0.9 % | Freq: Three times a day (TID) | INTRAVENOUS | Status: DC
Start: 2023-03-22 — End: 2023-03-22

## 2023-03-22 MED ORDER — MIDAZOLAM (PF) 1 MG/ML INJECTION SOLUTION
1 | INTRAVENOUS | Status: DC | PRN
Start: 2023-03-22 — End: 2023-03-22
  Administered 2023-03-22 (×2): 1 mg/mL via INTRAVENOUS

## 2023-03-22 MED ORDER — HEPARIN (PORCINE) (PF) 1,000 UNIT/500 ML IN 0.9 % SODIUM CHLORIDE IV
1000 | Status: CP
Start: 2023-03-22 — End: ?

## 2023-03-22 MED ORDER — ASPIRIN 81 MG CHEWABLE TABLET
81 | Status: CP
Start: 2023-03-22 — End: ?

## 2023-03-22 MED ORDER — LIDOCAINE HCL 20 MG/ML (2 %) INJECTION SOLUTION
20 | Status: CP
Start: 2023-03-22 — End: ?

## 2023-03-22 MED ORDER — FLUTICASONE PROPIONATE 50 MCG/ACTUATION NASAL SPRAY,SUSPENSION
50 | Freq: Every day | NASAL | Status: AC | PRN
Start: 2023-03-22 — End: ?

## 2023-03-22 MED ORDER — HEPARIN (PORCINE) 1,000 UNIT/ML INJECTION SOLUTION
1000 | INTRAVENOUS | Status: DC | PRN
Start: 2023-03-22 — End: 2023-03-22
  Administered 2023-03-22: 14:00:00 1000 unit/mL via INTRAVENOUS

## 2023-03-22 MED ORDER — PANTOPRAZOLE 40 MG TABLET,DELAYED RELEASE
40 | Freq: Every day | ORAL | Status: AC
Start: 2023-03-22 — End: ?

## 2023-03-22 MED ORDER — BUDESONIDE DR - ER 3 MG CAPSULE,DELAYED,EXTENDED RELEASE
3 | Freq: Every morning | ORAL | Status: AC
Start: 2023-03-22 — End: ?

## 2023-03-22 MED ORDER — ASPIRIN 81 MG CHEWABLE TABLET
81 | ORAL | Status: DC | PRN
Start: 2023-03-22 — End: 2023-03-22
  Administered 2023-03-22: 14:00:00 81 mg via ORAL

## 2023-03-22 MED ORDER — SODIUM CHLORIDE 0.9 % (FLUSH) INJECTION SYRINGE
0.9 % | INTRAVENOUS | Status: DC | PRN
Start: 2023-03-22 — End: 2023-03-22

## 2023-03-22 MED ORDER — NITROGLYCERIN 200 MCG/ML (SYRINGE) 5 ML
200 | Status: CP
Start: 2023-03-22 — End: ?

## 2023-03-22 MED ORDER — LIDOCAINE (PF) 20 MG/ML (2 %) INTRAVENOUS SOLUTION
20 | Status: DC | PRN
Start: 2023-03-22 — End: 2023-03-22
  Administered 2023-03-22: 14:00:00 20 mg/mL (2 %) via INTRADERMAL

## 2023-03-22 MED ORDER — IOHEXOL 350 MG IODINE/ML INTRAVENOUS SOLUTION
350 | Status: DC | PRN
Start: 2023-03-22 — End: 2023-03-22
  Administered 2023-03-22: 14:00:00 350 mg iodine/mL via INTRA_ARTERIAL

## 2023-03-22 MED ORDER — ORAL/ENTERAL REHYDRATION FLUIDS (ELECTROLYTE DRINK/WATER)
ORAL | Status: DC
Start: 2023-03-22 — End: 2023-03-22

## 2023-03-22 MED ORDER — MIDAZOLAM (PF) 1 MG/ML INJECTION SOLUTION
1 | Status: CP
Start: 2023-03-22 — End: ?

## 2023-03-22 MED ORDER — FENTANYL (PF) 50 MCG/ML INJECTION SOLUTION
50 | INTRAVENOUS | Status: DC | PRN
Start: 2023-03-22 — End: 2023-03-22
  Administered 2023-03-22 (×2): 50 mcg/mL via INTRAVENOUS

## 2023-03-22 MED ORDER — IOHEXOL 350 MG IODINE/ML INTRAVENOUS SOLUTION
350 | Status: CP
Start: 2023-03-22 — End: ?

## 2023-03-22 MED ORDER — FAMOTIDINE 20 MG TABLET
20 | Freq: Two times a day (BID) | ORAL | Status: AC
Start: 2023-03-22 — End: ?

## 2023-03-22 MED ORDER — LIDOCAINE (PF) 20 MG/ML (2 %) INJECTION SOLUTION
20 | Status: CP
Start: 2023-03-22 — End: ?

## 2023-03-22 MED ORDER — FENTANYL (PF) 50 MCG/ML INJECTION SOLUTION
50 | Status: CP
Start: 2023-03-22 — End: ?

## 2023-03-22 NOTE — Other
 Patient discharged home. IV access removed.  When releasing air from TR band, bleeding and bruising occurred, air placed back, Dr, [place] Senate made aware.  No other occurrences.  Discharge instructions and medications reviewed with patient.  All personal belongings sent home with patient.

## 2023-03-22 NOTE — Progress Notes
 HVC HANDOFF REPORTTo view medication administration, contrast administration,vital signs, and procedure details:Procedure documented in: Epic: see flowsheets, eMAR and Procedure log: (To access Procedure Log: go to chart review - imaging - double click on appropriate procedure - click on Procedure Log on right side of window)Verbal Report given to: [facility]U RN Procedural Nurse/Contact Number: [phone]     Performing Provider/Contact Number: CambiFamily contact information: see EpicProcedure performed: right and left heart cathAdditional comments: no interventionWas procedure done with anesthesia Support? No Last Vital Signs:Heart Rate: 59B/P: 114/68O2 Sat.: 96EAV procedure performed with moderate sedation: Yes: see procedural documentation for medications administeredPatient transferring on Oxygen: NoProcedural Access Site: Vascular: Sheath left in place? No Time sheath removed: 0908Vessel: right radialClosure Method Compression device: Radial Artery Compression Device (TR Band) rightTime device applied: 0906Air inflation volume (mL) 10Site description:Site description: No bleeding, ecchymosis or pain at site. Dressing intact.Pulses: Radial: pulse: 2+ (normal) Second Access Site? Yes: Time sheath removed: 0906Vessel: right femoral veinClosure Method Manual holdSite description:Site description: No bleeding, ecchymosis or pain at site. Dressing intact.Pulses: DP: pulse: 2+ (normal) Time of last site/pulse check: 0915Third Access Site? No See Epic documentation for LDAsElectronically signed:Sherley Bounds, RN 03/22/2023, 9:04 AM

## 2023-03-22 NOTE — Anesthesia Procedure Notes
 History & Physical Attestation and Pre-Procedural Moderate Sedation Assessment History & Physical Attestation: This is a non-emergent procedure. I have completed (or reviewed and attested to) a History and Physical Exam written within the last 30 days, which is in the patient record. The patient has been assessed and examined prior to this procedure and no changes were noted unless otherwise documented here: NonePhysician Airway Assessment:Normal: As the licensed practitioner certified in moderate sedation and based on my review, immediately prior to this procedure, of the airway evaluation and other pertinent documentation in the patient's record, this patient is a suitable candidate for moderate sedation during the planned procedure.Procedural Sedation Risk Assessment:Additional risks, if any, related to procedural sedation are noted here: None

## 2023-03-22 NOTE — Discharge Instructions
 Discharge instructions and care of your wrist/armafter a Cardiac Catheterization procedureperformed via the Radial ArteryFor 24 Hours following the procedureDo not subject your affected hand/arm to any forceful movements (i.e. supporting weight when rising from a chair or bed)Do not drive a car for 24 hoursThe dressing (band-aid) on the puncture site may be removed after 24 hours and left open to air.  If there is minor oozing, you may apply another Band-aid and remove after 12 hoursYou may shower on the day following your procedure.  Do not take a tub bath or submerge the puncture site in water for 3 days following the procedureFor 48 hours following the procedure Do not lift anything heavier than 3 to 5 pounds with the affected hand/armDo not operate a lawnmower, motorcycle, chainsaw, or all-terrain vehicle Avoid excessive (extension/flexion) wrist movement (i.e. supporting weight when rising from a chair or bed, push-ups, lifting garage doors, etc.)Do not engage in vigorous exercise (i.e. Tennis, Golf, [place]) using the affected armIf bleeding should occur following dischargeSit down and apply firm pressure to the puncture site with your fingers for 10 minutes If the bleeding stops, continue to sit quietly, keeping your wrist straight for 2 hours. Notify your physician as soon as possible If bleeding does not stop after 10 minutes or if there is a large amount of bleeding or spurting, call 911 immediately.  Do not drive yourself to the hospital. You should expect mild tingling in your hand and tenderness at the puncture site for up to 3 days. Notify your physician if these symptoms persist or if you experience:Change in color or temperature of the hand or armRedness, heat, or pus at the puncture siteChills or fever greater than 101.0 FT

## 2023-03-23 ENCOUNTER — Telehealth: Admit: 2023-03-23 | Payer: PRIVATE HEALTH INSURANCE | Attending: Cardiovascular Disease

## 2023-03-23 NOTE — Telephone Encounter
 Pt called to let us know that she had her cath yesterday at [facility] and was looking for the next steps. Message routed to the clinical team for review. Pt will wait to hear from [place] next week.

## 2023-04-29 ENCOUNTER — Ambulatory Visit: Admit: 2023-04-29 | Payer: PRIVATE HEALTH INSURANCE | Attending: Cardiothoracic Surgery

## 2023-04-29 ENCOUNTER — Encounter: Admit: 2023-04-29 | Payer: PRIVATE HEALTH INSURANCE | Attending: Cardiothoracic Surgery

## 2023-04-29 ENCOUNTER — Encounter: Admit: 2023-04-29 | Payer: PRIVATE HEALTH INSURANCE | Attending: Cardiovascular Disease

## 2023-04-29 ENCOUNTER — Ambulatory Visit: Admit: 2023-04-29 | Payer: PRIVATE HEALTH INSURANCE | Attending: Cardiovascular Disease

## 2023-04-29 VITALS — BP 123/65 | HR 78 | Temp 97.70000°F | Ht 67.0 in | Wt 103.7 lb

## 2023-04-29 DIAGNOSIS — E785 Hyperlipidemia, unspecified: Secondary | ICD-10-CM

## 2023-04-29 DIAGNOSIS — K219 Gastro-esophageal reflux disease without esophagitis: Secondary | ICD-10-CM

## 2023-04-29 DIAGNOSIS — M329 Systemic lupus erythematosus, unspecified: Secondary | ICD-10-CM

## 2023-04-29 DIAGNOSIS — E079 Disorder of thyroid, unspecified: Secondary | ICD-10-CM

## 2023-04-29 DIAGNOSIS — K5790 Diverticulosis of intestine, part unspecified, without perforation or abscess without bleeding: Secondary | ICD-10-CM

## 2023-04-29 DIAGNOSIS — I340 Nonrheumatic mitral (valve) insufficiency: Secondary | ICD-10-CM

## 2023-04-29 DIAGNOSIS — I38 Endocarditis, valve unspecified: Secondary | ICD-10-CM

## 2023-04-29 DIAGNOSIS — C4490 Unspecified malignant neoplasm of skin, unspecified: Secondary | ICD-10-CM

## 2023-04-29 DIAGNOSIS — D649 Anemia, unspecified: Secondary | ICD-10-CM

## 2023-04-29 DIAGNOSIS — I341 Nonrheumatic mitral (valve) prolapse: Principal | ICD-10-CM

## 2023-04-29 DIAGNOSIS — K589 Irritable bowel syndrome without diarrhea: Secondary | ICD-10-CM

## 2023-04-29 DIAGNOSIS — M79641 Pain in right hand: Secondary | ICD-10-CM

## 2023-04-29 MED ORDER — FISH OIL ORAL
Freq: Every day | ORAL | Status: AC
Start: 2023-04-29 — End: ?

## 2023-04-29 MED ORDER — DOCUSATE SODIUM 100 MG CAPSULE
100 | Freq: Two times a day (BID) | ORAL | Status: AC
Start: 2023-04-29 — End: ?

## 2023-04-29 MED ORDER — POLYETHYLENE GLYCOL ORAL POWDER (BOWEL PREP)
17 | Status: AC | PRN
Start: 2023-04-29 — End: ?

## 2023-04-29 MED ORDER — DAILY FIBER (PSYLLIUM-ASPARTAME) 3.4 GRAM ORAL POWDER PACKET
3.4 | ORAL | Status: AC | PRN
Start: 2023-04-29 — End: ?

## 2023-04-29 MED ORDER — CYANOCOBALAMIN (VIT B-12) 1,000 MCG/ML INJECTION SOLUTION
1000 | INTRAMUSCULAR | Status: AC
Start: 2023-04-29 — End: ?

## 2023-04-29 NOTE — Progress Notes
 Ms. Debra [name] is a 78 year old woman with asymptomatic severe mitral insufficiency. She is at increased risk of open mitral operation, though neither MVR nor [name] is indicated at this time. If she were to develop symptoms Clip would be the better first line choice for therapy.

## 2023-05-05 NOTE — Progress Notes
 [name] was asked to see Debra [name] by Barton Dubois because of the complexity of medical decision making, questions from the patient/family, decision making in determining clinical diagnosis, and decision making for treatment plan requiring physician involvement.Assessment and Plan: 78 year old female well known to me in the setting of mitral valve regurgitation.In a shared decision making fashion I had an extensive discussion with the patient regarding the natural history of mitral regurgitation and the different approaches which could be taken for the management of this disease process.  We discussed in detail both the natural history of the disease, the progression of the disease, potential interventions, and reviewed in detail the options of medical therapy, surgical valve intervention, and transcatheter valve intervention.  We went over the risks and benefits of each of these therapies as well as indications.  Given that the patient continues to do exceptionally well from a cardiovascular standpoint with no evidence of heart failure on exam or without any symptoms of heart failure, and after an extensive discussion with the patient, decision made to continue with close follow-up.  I have discussed with the patient that were she to develop any progressive signs of heart failure or to have changes on her echo that we might consider more strongly intervention at that time.  I will plan on seeing her back in the [facility] office 6 months.  Subjective: In assessing the patient I took the following history:78 year old female who I follow setting of mitral regurgitationMedical History, ROS, and current medications reviewed with patient and documented by APP.Marland KitchenObjective: Vital Signs:BP 123/65 (Site: r a, Position: Sitting, Cuff Size: Small)  - Pulse 78  - Temp 97.7 ?F (36.5 ?C) (Oral)  - Ht 5' 7 (1.702 m)  - Wt 47 kg  - BMI 16.24 kg/m? Wt Readings from Last 3 Encounters: 04/29/23 47 kg 04/29/23 47 kg 03/22/23 47 kg Physical Exam: General Appearance:  Pleasant, NAD, ambulates without assistance, alert and oriented x3  HEENT :  Anicteric, MMM, o/p clear  Neck :  Supple, JVP <6cm, no carotid bruits   CV :  S1 S2, RRR, 2/6 systolic murmur Pulm:  Clear bilaterally, no rales, no wheezes  Abdomen:   Soft, non-tender, non-distended, no guarding, no rebound, +bowel sounds  Extremities: Warm and well perfused, no edema, no cyanosis  Pulses: 2+ and symmetric all extremities, femoral pulses 2+ bilaterally, diminished pedal pulses Neuro : Equal strength bilaterally, no focal deficits.   Recent labs reviewedEcho: * Normal left ventricular size, wall thickness, systolic function and wall motion. Proximal septal thickening (sigmoid septum) noted.  LVEF calculated by biplane Simpson's was 62%.  Normal global longitudinal strain (-27%).  Diastolic function is abnormal, severity is indeterminable.* Normal right ventricular cavity size and systolic function.  Estimated right ventricular systolic pressure is 25 mmHg.* Severe left atrial enlargement. Moderate right atrial enlargement.* Severe mitral regurgitation.  By the PISA method, effective orifice area is 0.32 cm2, regurgitant volume is 62 ml, and regurgitant fraction is 57%.* Moderate-severe tricuspid regurgitation.* Mild pulmonic regurgitation.* Normal sinuses of Valsalva with a diameter of 3.4 cm and normal ascending aorta with a diameter of 3.7 cm.* Trivial pericardial effusion.* No prior study available for comparison.NYHA Class: 1ECG: sinus rhythm at 60 beats per minute with PACsLevel of decision making was high due to high risk for morbidity as a result of valvular heart disease requiring surgical/procedural management as well as independent personal review of tests including: labs, ECG, echo, CTA, cardiac cath.Signed:John K. Forrest, MDProfessor of Medicine, [employer]  School of MedicineDirector of Interventional Cardiology Director of Structural Heart ProgramYale Gower Heart Health SystemYale [facility] Patient Re-evaluationDate of Encounter: 1/2/2025Reason for Follow-up: Advanced Therapies for Mitral Valve Disease Cardiologist: Emi Holes, MDPCP: Josiah Lobo, APRNOther Providers: Rheumatology:  Si Raider. Ian Malkin, MDAssessment and Plan: Patient was re-evaluated today in valve clinic by our multidisciplinary valve team.  This note reflects the findings of this heart team.  In summary, Debra [name] is a  78 y.o.female who is re-evaluated in setting of severe mitral regurgitation.Aylen Casteen currently reports no symptoms of heart failure.  During visit today we reviewed and discussed, with patient (and family), in detail all previous testing that had been obtainedNo further work-up required to determine appropriate therapy for patient's mitral valve disease.Debra [name] case was discussed with the multidisciplinary team. Pertinent imaging was reviewed. 2 jets - holosystolic and late systolic jet, with mod-severe MR. MVA 5.5 cm2. MG of 1 mmHg. Anatomy favorable for MitraClip.  Shared decision making performed with patient with no therapy decided upon at this time.Plan is for 6 month follow-up in [facility].Thank You for allowing [place] to participate in the care of your patient. Please don't hesitate to contact us if any questions arise related to our evaluation.Subjective: Ms. Debra [name] was seen in our valve clinic today by our multidisciplinary team. Debra [name] is a 78 y.o.female who presents today for re-evaluation of mitral valve disease.Debra [name] has a past medical history of Anemia, Bilateral hand pain (11/11/2017), Disease of thyroid gland, Diverticulosis, GERD (gastroesophageal reflux disease), Heart valve disease, Hyperlipidemia, IBS (irritable bowel syndrome), Mitral regurgitation, Skin cancer, and SLE (systemic lupus erythematosus) (HC Code) (HC CODE) (HC Code).History of Present Illness:  Debra [name] is a very pleasant 78 y.o.female  who presents for re-evaluation in our multidisciplinary Valve Clinic.  Sylvie Etheridge was last seen on 03/11/2023 at [facility] office by Dr. Randa Spike. Since this last Structural Heart and Valve Clinic visit patient has undergone Right and Left Cardiac Catheterization on 03/22/2023.Kaelah Stumpf currently resides in [place], [place].  Fern Flaten is accompanied in clinic today by her daughter, Toma Copier. Ltonya Chaires reports no symptoms of shortness of breath or dyspnea on exertion.  Iula Reichling has noticed no change in activity level and is able to participate in all desired activities.Simonne Garfield reports upper left chest discomfort that may be secondary to arthritic shoulder.Taimi Piner reports no symptoms of lightheadedness, dizziness or syncope.  Tiwatope Denny has had no recent hospitalizations. Prior imaging studies were reviewed and are detailed below.Review of Systems: Gen: Mild fatigueHEENT/Dental: Negative; Stable Dentition Cardio: Left upper chest/shoulder discomfortPulm: negativeGastrointestinal: NegativeGenitourinary: NegativeEndo/Diabetes: no diabetesHeme: NegativeMS: Rheumatologic and Arthritic issuesFalls in past 6 months: noneNeuro: negativeSkin: No open wounds The remainder of the 12 point review of systems was reviewed with the patient and is negative.Medical History: PMH PSH  She    has a past medical history of Anemia, Bilateral hand pain (11/11/2017), Disease of thyroid gland, Diverticulosis, GERD (gastroesophageal reflux disease), Heart valve disease, Hyperlipidemia, IBS (irritable bowel syndrome), Mitral regurgitation, Skin cancer, and SLE (systemic lupus erythematosus) (HC Code) (HC CODE) (HC Code). She  has a past surgical history that includes Mole removal (2006); Thyroidectomy (1969); Oophorectomy (1961); Colonoscopy (11/25/2010); Esophagogastroduodenoscopy (04/16/2009); EGD/Colonoscopy (07/20/2005); EGD/Colonoscopy (09/11/1999); Colonoscopy (02/26/2015); Cataract Removal/IOL Implant; Skin cancer excision; and Cholecystectomy. Social History Family History She  reports that she has never smoked. She has never used smokeless tobacco. She reports that she does not currently use alcohol after a past  usage of about 2.0 standard drinks of alcohol per week. She reports that she does not use drugs. Family History Problem Relation Age of Onset  Diabetes Daughter   Breast cancer Maternal Aunt   Colon cancer Maternal Aunt   Colon cancer Maternal Grandfather   Medications Current Outpatient Medications Medication Sig Dispense Refill  acetaminophen (TYLENOL) 500 MG tablet Take 2 tablets (1,000 mg total) by mouth every 6 (six) hours as needed.    budesonide (ENTOCORT EC) 3 mg DR-ER 24 hr capsule Take 2 capsules (6 mg total) by mouth every morning.    calcium carbonate-vitamin D3 (OSCAL) 250-125 mg-unit per tablet Take by mouth.    citalopram (CELEXA) 20 MG tablet Take 0.5 tablets (10 mg total) by mouth daily.  0  cyanocobalamin 1,000 mcg/mL injection vial Inject 1 mL (1,000 mcg total) into the muscle every 30 (thirty) days.    denosumab (PROLIA SUBQ) Inject under the skin. Twice yearly    docosahexaenoic acid/epa (FISH OIL ORAL) Take by mouth daily. -per pt two capsules daily.    docusate sodium (COLACE) 100 mg capsule Take 1 capsule (100 mg total) by mouth 2 (two) times daily.    famotidine (PEPCID) 20 mg tablet Take 1 tablet (20 mg total) by mouth 2 (two) times daily.    fluticasone propionate (FLONASE) 50 mcg/actuation nasal spray Use 1 spray in each nostril daily as needed.    gabapentin (NEURONTIN) 300 mg capsule Take 1 capsule (300 mg total) by mouth 4 (four) times daily.    glucosamine-chondroitin 500-400 mg tablet Take 1 tablet by mouth daily.    hydroxychloroquine (PLAQUENIL) 200 mg tablet TAKE 1 TABLET TWICE A DAY  0  iron 18 mg Tab Take 25 mg by mouth Daily @1700 .    levothyroxine (SYNTHROID, LEVOTHROID) 112 MCG tablet Take 1 tablet (112 mcg total) by mouth. 112 mcg one tab twice weekly 125 mcg five times a week    midodrine (PROAMATINE) 2.5 mg tablet Take 1 tablet (2.5 mg total) by mouth 2 (two) times daily with breakfast and dinner. Do not take any doseslater than evening meal or less than 4 hours before bedtime.    multivitamin capsule Take 1 capsule by mouth daily.    pantoprazole (PROTONIX) 40 mg tablet Take 1 tablet (40 mg total) by mouth daily.    polyethylene glycol (MIRALAX) 17 gram/dose powder as needed.    psyllium husk, aspartame, (DAILY FIBER, PSYLLIUM-ASPART,) 3.4 gram packet Take 1 packet by mouth as needed.    syringe with needle (BD LUER-LOK SYRINGE) 3 mL 25 x 5/8 Use as directed with B12 6 each 1  cyanocobalamin 1000 MCG tablet Take 1 tablet (1,000 mcg total) by mouth daily. Injection  once a month   No current facility-administered medications for this visit.  Allergies She is allergic to tramadol, cyclobenzaprine, and niacin preparations. Nickel Allergy: NoPrior Transfusions: NoneObjective: Vital Signs: BP 123/65 (Site: r a, Position: Sitting, Cuff Size: Small)  - Pulse 78  - Temp 97.7 ?F (36.5 ?C) (Oral)  - Ht 5' 7 (1.702 m)  - Wt 47 kg  - BMI 16.24 kg/m? Wt Readings from Last 3 Encounters: 04/29/23 47 kg 04/29/23 47 kg 03/22/23 47 kg Physical Exam: General Appearance:  Pleasant, NAD, ambulates without assistance  HEENT/Dental :  Anicteric, MMM, o/p clear; Dentition stable Neck :  Supple, JVP <6cm, no carotid bruits, normal carotid upstroke   CV :  diminished S1 and normal S2,  Irregular , with a 2/6 holosystolic MR murmur Pulm:  Clear bilaterally, no rales, no wheezes  Abdomen:   Soft, non-tender, non-distended, no guarding, no rebound, +bowel sounds  Extremities: Warm and well perfused, no edema, no cyanosis  Pulses: 2+ and symmetric femoral pulses, palpable pedal pulses. Neuro : Equal strength bilaterally, no focal deficits.     LABS:CBCLab Results Component Value Date  WBC 3.6 (L) 03/17/2023  HGB 12.8 03/17/2023  HCT 37.90 03/17/2023  MCV 102.4 (H) 03/17/2023  PLT 142 (L) 03/17/2023 BMPLab Results Component Value Date  CREATININE 0.59 03/17/2023  BUN 14 03/17/2023  NA 137 03/17/2023  K 4.0 03/17/2023  CL 103 03/17/2023  CO2 29 03/17/2023 Cardiographics:EKG: Performed 04/29/2023 @ 1306Sinus arrhythmiaProbable left atrial enlargementBorderline T abnormalities, inferior leads Echocardiogram: Performed on 03/03/2023 * Normal left ventricular size, wall thickness, systolic function and wall motion. Proximal septal thickening (sigmoid septum) noted.  LVEF calculated by biplane Simpson's was 62%.  Normal global longitudinal strain (-27%).  Diastolic function is abnormal, severity is indeterminable.* Normal right ventricular cavity size and systolic function.  Estimated right ventricular systolic pressure is 25 mmHg.* Severe left atrial enlargement. Moderate right atrial enlargement.* Severe mitral regurgitation.  By the PISA method, effective orifice area is 0.32 cm2, regurgitant volume is 62 ml, and regurgitant fraction is 57%.* Moderate-severe tricuspid regurgitation.* Mild pulmonic regurgitation.* Normal sinuses of Valsalva with a diameter of 3.4 cm and normal ascending aorta with a diameter of 3.7 cm.* Trivial pericardial effusion.* No prior study available for comparison.Transesophageal Echocardiogram: Performed on 01/17/2024AddendumAddendum by Missy Sabins, MD on 05/13/2022  2:50 PM ESTNormal left ventricular size and systolic function.Right ventricle is moderately dilated with normal systolic function.Myxomatous changes of the mitral leaflets with mild bileaflet prolapseresulting in moderate to severe mitral regurgitation.Myxomatous changes of the tricuspid leaflets with mild bileaflet prolapseresulting in moderate to severe tricuspid regurgitation.Severe biatrial enlargement.No thrombus seen in the left atrium or left atrial appendage.Left VentricleLeft ventricular size is normal. Normal wall motion. Normal systolic function. Unable to assess diastolic function.Right VentricleRight ventricle is moderately dilated. Normal systolic function.Left AtriumLeft atrium is severely dilated. No PFO present by color Doppler. Monolobular appendage. No thrombus seen in the left atrial appendage. Normal appendage flow velocity. Normal flow patterns and systolic dominance in the pulmonary veins. No spontaneous echo contrast visualized.Right AtriumRight atrium is severely dilated.IVC/SVCIVC was not assessed.Mitral ValveFindings consistent with myxomatous degeneration. Moderately thickened leaflets. Mild bileaflet prolapse. Moderate to severe transvalvular regurgitation. MV regurgitant volume by PISA is 65 mL. MV EROA by PISA is 0.32 cm2. No stenosis. MVA by 3D MPR 5.5 cm2.Tricuspid ValveFindings consistent with myxomatous degeneration. Moderately thickened leaflets. Mild bileaflet prolapse. Moderate to severe transvalvular regurgitation. Pulmonary pressure may be underestimated. No stenosis.Aortic ValveThe valve structure is trileaflet. Mildly thickened cusps. Mildly calcified cusps. Trace transvalvular regurgitation. No stenosis. 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 Trace transvalvular regurgitation. No stenosis.Ascending AortaNormal sized aortic root (sinus). Atherosclerosis with mild thickening.PericardiumNo pericardial effusion.Study DetailsA complete echo was performed using complete 2D, color flow Doppler, spectral Doppler and complete 3D. During the study the upper esophageal, mid esophageal, deep esophageal, transgastric, deep transgastric and descending thoracic views were captured. Rhythm was unable to be determined. The probe was inserted by the cardiologist. There was no probe insertion difficulty. Moderate sedation was administered. Lidocaine was administered during the study. There were no complications during the procedure.Prior StudyNo prior study available for comparison.CTA: None  Right Heart and Left Heart Cath: Performed on 03/22/2023: Dr. Beatriz Stallion Resting Right    RA Mean  mmHg 2   RV Systolic  21   RV Diastolic   3 Pulmonary    PA  mmHg 21/7 (11)   PCW Mean  mmHg 5.0 Left Ventricle    LV EDP   10  Cardiac Output and ResistancePhases Resting   CO (L/Min)   4.90   CI (L/Min/m2)   3.20  Valve CalculationsPhases Resting   QP/QS Ratio   1.00000 Left Main Visualized. No significant disease.  Left Anterior Descending Visualized. Large in caliber smooth in contour. Provides small caliber diagonal branches and multiple septal perforators. No significant flow-limiting disease.  Left Circumflex Visualized. Provides a large caliber obtuse marginal branch without significant disease.  Right Coronary Artery Visualized. Large in caliber with mild luminal irregularities. Dominant vessel. Gives rise to a moderate caliber right PDA and right PL branches without significant disease.  Conclusion:  No significant flow-limiting epicardial coronary artery disease  Normal right heart catheterization  Normal left ventricular end-diastolic pressure Surgical Evaluation: 04/29/2023: Dr. Maxcine Ham Score: 5.8%Independent Living: AssessedKCCQ: Obtained5 Meter Walk Test: Performed If Performed: TimeLess than 0.37m/sec Interpretation: Less than 0.45m/sec: Household ambulator0.4 to 0.8 m/sec: Limited community ambulator0.60m/sec to 1.59m/sec: Community ambulator1.67m/sec or higher: Able to safely cross streets NYHA Class: ICCS Anginal Class: 0Pulmonology Consultation and/or PFTs: NoneGeriatric Consultation: N/ADental Evaluation: N/AKatz Frailty Index:Activities  Independence  Dependence  Points (1 or 0)  Points (1) NO supervision, direction, or personal assistance  Points (0)WITH supervision, direction, personal assistance or total care  Bathing   (1 point) Bathes self completely or needs help in bathing only a single part of the body such as the back, genital area or disabled extremity.  (0 points) Needs help with bathing more than one part of the body, getting in or out of the tub or shower. Requires total bathing.  POINTS: 1   Dressing   (1 point) Gets clothes from closets and drawers and puts on clothes and outer garments complete with fasteners. May have help tying shoes.  (0 points) Needs help with dressing self or needs to be completely dressed.  POINTS: 1   Toileting   (1 point) Goes to toilet, gets on and off, arranges clothes, cleans genital area without help.  (0 points) Needs help transferring to the toilet, cleaning self or uses bedpan or commode.  POINTS: 1    Transferring   (1 point) Moves in and out of bed or chair unassisted. Mechanical transferring aides are acceptable.  (0 points) Needs help in moving from bed to chair or requires a complete transfer.  POINTS: 1   Continence   (1 point) Exercises complete self control over urination and defecation.  (0 points) Is partially or totally incontinent of bowel or bladder.  POINTS: 1   Feeding   (1 point) Gets food from plate into mouth without help. Preparation of food may be done by another person.  (0 points) Needs partial or total help with feeding or requires parenteral feeding.  POINTS: 1   Total points: 6 6 points: high (patient independent).0 points: low (patient very dependent).Signed:Leif Loflin, PA-CDouglas Art gallery manager, PAInterventional CardiologyStructural Heart Disease ProgramYale Transcatheter Valve ProgramYale [facility]1/05/2023

## 2023-05-28 ENCOUNTER — Ambulatory Visit: Admit: 2023-05-28 | Payer: PRIVATE HEALTH INSURANCE | Attending: Urology

## 2023-05-28 ENCOUNTER — Encounter: Admit: 2023-05-28 | Payer: PRIVATE HEALTH INSURANCE | Attending: Urology

## 2023-05-28 VITALS — Ht 67.0 in | Wt 106.0 lb

## 2023-05-28 DIAGNOSIS — D649 Anemia, unspecified: Secondary | ICD-10-CM

## 2023-05-28 DIAGNOSIS — M79641 Pain in right hand: Secondary | ICD-10-CM

## 2023-05-28 DIAGNOSIS — R3915 Urgency of urination: Secondary | ICD-10-CM

## 2023-05-28 DIAGNOSIS — K589 Irritable bowel syndrome without diarrhea: Secondary | ICD-10-CM

## 2023-05-28 DIAGNOSIS — M329 Systemic lupus erythematosus, unspecified: Secondary | ICD-10-CM

## 2023-05-28 DIAGNOSIS — C4490 Unspecified malignant neoplasm of skin, unspecified: Secondary | ICD-10-CM

## 2023-05-28 DIAGNOSIS — E785 Hyperlipidemia, unspecified: Secondary | ICD-10-CM

## 2023-05-28 DIAGNOSIS — R351 Nocturia: Secondary | ICD-10-CM

## 2023-05-28 DIAGNOSIS — K219 Gastro-esophageal reflux disease without esophagitis: Secondary | ICD-10-CM

## 2023-05-28 DIAGNOSIS — N3941 Urge incontinence: Secondary | ICD-10-CM

## 2023-05-28 DIAGNOSIS — I38 Endocarditis, valve unspecified: Secondary | ICD-10-CM

## 2023-05-28 DIAGNOSIS — E079 Disorder of thyroid, unspecified: Secondary | ICD-10-CM

## 2023-05-28 DIAGNOSIS — I340 Nonrheumatic mitral (valve) insufficiency: Secondary | ICD-10-CM

## 2023-05-28 DIAGNOSIS — K5790 Diverticulosis of intestine, part unspecified, without perforation or abscess without bleeding: Secondary | ICD-10-CM

## 2023-05-28 MED ORDER — OXYBUTYNIN CHLORIDE IMMEDIATE RELEASE 5 MG TABLET
5 | ORAL_TABLET | Freq: Every evening | ORAL | 12 refills | Status: AC
Start: 2023-05-28 — End: ?

## 2023-05-28 NOTE — Progress Notes
 [facility]	Urology Office NoteDate of visit: 1/31/2025Chief complaint:  Nocturia, urinary incontinenceHistory of Present Illness: Sasha Rogel is a 78 y.o. woman with a history of 1. NocturiaNo success with Myrbetriq initially.  Then tried Ditropan but without relief.2. Urgency urinary incontinence.  Daytime leakage.  Goes through 1-2 pads per day.Review of Systems1. Constitutional: Good appetite, normal energy level, denies fever, chills 2. Eyes: no glaucoma or visual problems3. ENT: No hearing loss, no nosebleeds, no sinus pain, no ringing, no dysphagia4. Cardiovascular: no chest pain or pressure, no fainting spells, no nitrates for chest pain . 5. Respiratory: No dyspnea, no asthma, no wheezing no coughing6. GI: No upper or lower GI bleeding, no reflux/heartburn, no diarrhea, no constipation, no nausea, no vomiting7: Extremities: no claudication, no leg swelling8: Heme/lymph: no easy bleeding, no easy bruising9: Metabolic/endocrine: No excessive thirst, no heat or cold intolerance10: Musculoskeletal: No joint pains or arthritis, no mobility limitations11:Skin: No growths, rashes or itching12: Psych: No depression, no anxiety, no mood or thought disorders13:Neurological: denies transient paralysis, weakness, numbness, tingling, seizures, tremors, headaches PMH:  has a past medical history of Anemia, Bilateral hand pain (11/11/2017), Disease of thyroid gland, Diverticulosis, GERD (gastroesophageal reflux disease), Heart valve disease, Hyperlipidemia, IBS (irritable bowel syndrome), Mitral regurgitation, Skin cancer, and SLE (systemic lupus erythematosus) (HC Code) (HC CODE) (HC Code).PSH:  has a past surgical history that includes Mole removal (2006); Thyroidectomy (1969); Oophorectomy (1961); Colonoscopy (11/25/2010); Esophagogastroduodenoscopy (04/16/2009); EGD/Colonoscopy (07/20/2005); EGD/Colonoscopy (09/11/1999); Colonoscopy (02/26/2015); Cataract Removal/IOL Implant; Skin cancer excision; and Cholecystectomy.Medications: Current Outpatient Medications:   acetaminophen (TYLENOL) 500 MG tablet, Take 2 tablets (1,000 mg total) by mouth every 6 (six) hours as needed., Disp: , Rfl:   budesonide (ENTOCORT EC) 3 mg DR-ER 24 hr capsule, Take 2 capsules (6 mg total) by mouth every morning., Disp: , Rfl:   calcium carbonate-vitamin D3 (OSCAL) 250-125 mg-unit per tablet, Take by mouth., Disp: , Rfl:   citalopram (CELEXA) 20 MG tablet, Take 0.5 tablets (10 mg total) by mouth daily., Disp: , Rfl: 0  cyanocobalamin 1,000 mcg/mL injection vial, Inject 1 mL (1,000 mcg total) into the muscle every 30 (thirty) days., Disp: , Rfl:   denosumab (PROLIA SUBQ), Inject under the skin. Twice yearly, Disp: , Rfl:   docosahexaenoic acid/epa (FISH OIL ORAL), Take by mouth daily. -per pt two capsules daily., Disp: , Rfl:   docusate sodium (COLACE) 100 mg capsule, Take 1 capsule (100 mg total) by mouth 2 (two) times daily., Disp: , Rfl:   famotidine (PEPCID) 20 mg tablet, Take 1 tablet (20 mg total) by mouth 2 (two) times daily., Disp: , Rfl:   fluticasone propionate (FLONASE) 50 mcg/actuation nasal spray, Use 1 spray in each nostril daily as needed., Disp: , Rfl:   gabapentin (NEURONTIN) 300 mg capsule, Take 1 capsule (300 mg total) by mouth 4 (four) times daily., Disp: , Rfl:   glucosamine-chondroitin 500-400 mg tablet, Take 1 tablet by mouth daily., Disp: , Rfl:   hydroxychloroquine (PLAQUENIL) 200 mg tablet, TAKE 1 TABLET TWICE A DAY, Disp: , Rfl: 0  iron 18 mg Tab, Take 25 mg by mouth Daily @1700 ., Disp: , Rfl:   levothyroxine (SYNTHROID, LEVOTHROID) 112 MCG tablet, Take 1 tablet (112 mcg total) by mouth. 112 mcg one tab twice weekly 125 mcg five times a week, Disp: , Rfl:   midodrine (PROAMATINE) 2.5 mg tablet, Take 1 tablet (2.5 mg total) by mouth 2 (two) times daily with breakfast and dinner. Do not take any doseslater than evening meal  or less than 4 hours before bedtime., Disp: , Rfl:   multivitamin capsule, Take 1 capsule by mouth daily., Disp: , Rfl:   pantoprazole (PROTONIX) 40 mg tablet, Take 1 tablet (40 mg total) by mouth daily., Disp: , Rfl:   polyethylene glycol (MIRALAX) 17 gram/dose powder, as needed., Disp: , Rfl:   psyllium husk, aspartame, (DAILY FIBER, PSYLLIUM-ASPART,) 3.4 gram packet, Take 1 packet by mouth as needed., Disp: , Rfl:   syringe with needle (BD LUER-LOK SYRINGE) 3 mL 25 x 5/8, Use as directed with B12, Disp: 6 each, Rfl: 1Allergy: Allergies Allergen Reactions  Tramadol Vomiting  Cyclobenzaprine Rash  Niacin Preparations Rash Social: Social History Socioeconomic History  Marital status: Married   Spouse name: Not on file  Number of children: Not on file  Years of education: Not on file  Highest education level: Not on file Occupational History  Not on file Tobacco Use  Smoking status: Never  Smokeless tobacco: Never Substance and Sexual Activity  Alcohol use: Not Currently   Alcohol/week: 2.0 standard drinks of alcohol   Types: 2 Glasses of wine per week  Drug use: No  Sexual activity: Not on file Other Topics Concern  Not on file Social History Narrative  Not on file Social Drivers of Health Financial Resource Strain: Unknown (07/06/2019)  [facility] [employer] [facility] Strain   5. Do you ever have problems being able to afford everything you need?: Not on file   4. Over the last 6 months, have you had trouble paying your heating or electricity bill?: Not on file Food Insecurity: Unknown (07/06/2019)  [facility]  Food Insecurity   3. Over the last 6 months, have you worried about having enough money for food?: Not on file Transportation Needs: Unknown (07/06/2019)  [facility] from [facility] Transportation Needs   8. In the last 6 months, have you had trouble getting transportation to medical appointments? : Not on file Physical Activity: Insufficiently Active (03/07/2023)  Received from [facility]  Physical Activity   On average, how many days per week do you engage in moderate to strenuous exercise (like a brisk walk)?: 1 day   On average, how many minutes do you exercise per day at this level?: 30 min Stress: Stress Concern Present (03/07/2023)  Received from [facility] - Occupational Stress Questionnaire   Feeling of Stress : Rather much Social Connections: Unknown (07/06/2019)  [facility]  Social Connections   11. How often do you see or talk to people that you care about and feel close to? (Talking to friends on the phone or virtually, visiting friends/family, religious service, clubs): Not on file Intimate Partner Violence: Unknown (05/13/2022)  Received from [facility], [facility]  Humiliation, Afraid, Rape, and Kick questionnaire   Fear of Current or Ex-Partner: No   Emotionally Abused: Not on file   Physically Abused: Not on file   Sexually Abused: Not on file Housing Stability: Unknown (07/06/2019)  [facility]  Housing Stability   1. Over the last 6 months, have you worried that you may not have a steady place to live?: Not on file   2. What is your living situation today? : Not on file Family: Family History Problem Relation Age of Onset  Diabetes Daughter   Breast cancer Maternal  Aunt   Colon cancer Maternal Aunt   Colon cancer Maternal Grandfather  Exam: There were no vitals filed for this visit. There is no height or weight on file to calculate BMI.Constitutional & General: Normal development and no deformities. No gross nutritional deficits. Normal habitus.  Well groomed. Well-appearing.Eyes: No scleral icterus. No conjunctival pallor. Symmetric pupils.Ears, Nose, Mouth & Throat: Moist mucus membranes. Hearing intact to soft voice. Neck: No masses. Trachea symmetric. Respiratory: No use of accessory muscles. Clear to auscultation bilaterally.Cardiovascular: Regular rate and rhythm. 2+ femoral pulses. No pedal edema. GI/Abdomen: Soft. Non-tender. Non-distended. No mass. No hernia. No scars. No CVA tenderness.Genitourinary: DRE: Lymphatic: No cervical or supraclavicular lymphadenopathy. No inguinal lymphadenopathy.Musculoskeletal: No spinal or paraspinal tenderness. No gait abnormality. No clubbing or cyanosis. Skin: No rashes, lesions or ulcers. Soft and without nodules or induration. Psychiatric: Normal judgement and insight. Normal mood and affect. Labs: Lab Results Component Value Date  WBC 3.6 (L) 03/17/2023  HGB 12.8 03/17/2023  HCT 37.90 03/17/2023  MCV 102.4 (H) 03/17/2023  PLT 142 (L) 03/17/2023   Chemistry    Component Value Date/Time  NA 137 03/17/2023 1005  K 4.0 03/17/2023 1005  CL 103 03/17/2023 1005  CO2 29 03/17/2023 1005  BUN 14 03/17/2023 1005  CREATININE 0.59 03/17/2023 1005  GLU 113 (H) 03/17/2023 1005  PROT 7.5 05/25/2016 1119    Component Value Date/Time  CALCIUM 8.8 03/17/2023 1005  ALKPHOS 66 05/25/2016 1119  AST 22 05/25/2016 1119  AST 22 01/03/2015 0553  ALT 29 05/25/2016 1119  ALT 27 01/03/2015 0553  BILITOT 0.4 05/25/2016 1119  ALBUMIN 4.2 05/25/2016 1119  @[email or web address] ([email or web address]  to redirect to the Timeline version of the REVFS SmartLink. Pathology: Imaging: I reviewed the images and radiologist's report of the following:No results found.Impression: Darlyne Schmiesing is a 78 y.o. woman with a history of 1. Urge incontinence.  Discussed treatment options, including pelvic floor therarpy2. Nocturia.  Trial of Ditropan 5 mg q hsPlan:  F/U 3 months with APP.On the day of this encounter, a total of 40 minutes was spent by me with regards to this patient's management.  Adele Dan, MDAssistant Professor of [employer] of [employer]

## 2023-06-01 ENCOUNTER — Telehealth: Admit: 2023-06-01 | Payer: PRIVATE HEALTH INSURANCE | Attending: Urology

## 2023-06-01 NOTE — Telephone Encounter
 [facility] MESSAGETime of call:   10:13 AMCaller:   [name], BEVERLYSpecialist you are calling for:  Dr Christen Butter for call:   Patient called in to speak with a nurse regarding symptoms after taking oxybutynin (DITROPAN) 5 mg immediate release tablet, patient states since taking the mediation she is experiencing extremely dry mouth, patient asking for any suggestions on what she can do for the dry mouth, please reach back out to discuss further. Does caller request to speak to someone urgently?  yes   Best telephone number for callback: [phone] Best time to return call:  anytimePermission to leave message:  yes

## 2023-06-01 NOTE — Telephone Encounter
 Debra [name] call returned c/o dry mouth from ditropan. Recommended hard candy, cough drops , frequent sips of water and to take med at night

## 2023-06-17 ENCOUNTER — Encounter: Admit: 2023-06-17 | Payer: PRIVATE HEALTH INSURANCE | Attending: Urology

## 2023-06-17 ENCOUNTER — Inpatient Hospital Stay: Admit: 2023-06-17 | Discharge: 2023-06-17 | Payer: PRIVATE HEALTH INSURANCE

## 2023-06-17 ENCOUNTER — Encounter: Admit: 2023-06-17 | Payer: PRIVATE HEALTH INSURANCE

## 2023-06-17 DIAGNOSIS — R109 Unspecified abdominal pain: Secondary | ICD-10-CM

## 2023-06-17 DIAGNOSIS — Z79899 Other long term (current) drug therapy: Secondary | ICD-10-CM

## 2023-06-17 DIAGNOSIS — M810 Age-related osteoporosis without current pathological fracture: Secondary | ICD-10-CM

## 2023-06-17 DIAGNOSIS — I519 Heart disease, unspecified: Secondary | ICD-10-CM

## 2023-06-17 DIAGNOSIS — R42 Dizziness and giddiness: Secondary | ICD-10-CM

## 2023-06-17 DIAGNOSIS — R6883 Chills (without fever): Secondary | ICD-10-CM

## 2023-06-17 DIAGNOSIS — R3915 Urgency of urination: Secondary | ICD-10-CM

## 2023-06-17 DIAGNOSIS — E039 Hypothyroidism, unspecified: Secondary | ICD-10-CM

## 2023-06-17 DIAGNOSIS — M130 Polyarthritis, unspecified: Secondary | ICD-10-CM

## 2023-06-17 LAB — URINALYSIS WITH CULTURE REFLEX      (BH LMW YH)
BKR BILIRUBIN, UA: NEGATIVE
BKR BLOOD, UA: NEGATIVE
BKR GLUCOSE, UA: NEGATIVE
BKR KETONES, UA: NEGATIVE
BKR LEUKOCYTE ESTERASE, UA: NEGATIVE
BKR NITRITE, UA: NEGATIVE
BKR PH, UA: 6.5 (ref 5.5–7.5)
BKR PROTEIN, UA: NEGATIVE
BKR SPECIFIC GRAVITY, UA: 1.01 (ref 1.005–1.030)
BKR UROBILINOGEN, UA (MG/DL): <2 mg/dL (ref <=2.0)

## 2023-06-17 LAB — CBC WITH AUTO DIFFERENTIAL
BKR WAM ABSOLUTE IMMATURE GRANULOCYTES.: 0.01 x 1000/ÂµL (ref 0.00–0.30)
BKR WAM ABSOLUTE LYMPHOCYTE COUNT.: 0.83 x 1000/ÂµL (ref 0.60–3.70)
BKR WAM ABSOLUTE NRBC (2 DEC): 0 x 1000/ÂµL (ref 0.00–1.00)
BKR WAM ANC (ABSOLUTE NEUTROPHIL COUNT): 3.12 x 1000/ÂµL (ref 2.00–7.60)
BKR WAM BASOPHIL ABSOLUTE COUNT.: 0.05 x 1000/ÂµL (ref 0.00–1.00)
BKR WAM BASOPHILS: 1.1 % (ref 0.0–1.4)
BKR WAM EOSINOPHIL ABSOLUTE COUNT.: 0.02 x 1000/ÂµL (ref 0.00–1.00)
BKR WAM EOSINOPHILS: 0.5 % (ref 0.0–5.0)
BKR WAM HEMATOCRIT (2 DEC): 35.7 % (ref 35.00–45.00)
BKR WAM HEMOGLOBIN: 12.1 g/dL (ref 11.7–15.5)
BKR WAM IMMATURE GRANULOCYTES: 0.2 % (ref 0.0–1.0)
BKR WAM LYMPHOCYTES: 19 % (ref 17.0–50.0)
BKR WAM MCH (PG): 34.5 pg — ABNORMAL HIGH (ref 27.0–33.0)
BKR WAM MCHC: 33.9 g/dL (ref 31.0–36.0)
BKR WAM MCV: 101.7 fL — ABNORMAL HIGH (ref 80.0–100.0)
BKR WAM MONOCYTE ABSOLUTE COUNT.: 0.33 x 1000/ÂµL (ref 0.00–1.00)
BKR WAM MONOCYTES: 7.6 % (ref 4.0–12.0)
BKR WAM MPV: 10.3 fL (ref 8.0–12.0)
BKR WAM NEUTROPHILS: 71.6 % (ref 39.0–72.0)
BKR WAM NUCLEATED RED BLOOD CELLS: 0 % (ref 0.0–1.0)
BKR WAM PLATELETS: 148 x1000/ÂµL — ABNORMAL LOW (ref 150–420)
BKR WAM RDW-CV: 11.9 % (ref 11.0–15.0)
BKR WAM RED BLOOD CELL COUNT.: 3.51 M/ÂµL — ABNORMAL LOW (ref 4.00–6.00)
BKR WAM WHITE BLOOD CELL COUNT: 4.4 x1000/ÂµL (ref 4.0–11.0)

## 2023-06-17 LAB — COMPREHENSIVE METABOLIC PANEL
BKR ALANINE AMINOTRANSFERASE (ALT): 77 U/L (ref 12–78)
BKR ALBUMIN: 3.8 g/dL (ref 3.4–5.0)
BKR ALKALINE PHOSPHATASE: 55 U/L (ref 45–117)
BKR ANION GAP (LM): 5 mmol/L (ref 5–15)
BKR ASPARTATE AMINOTRANSFERASE (AST): 39 U/L — ABNORMAL HIGH (ref 15–37)
BKR BILIRUBIN TOTAL: 0.5 mg/dL (ref 0.2–1.0)
BKR BLOOD UREA NITROGEN: 13 mg/dL (ref 7–18)
BKR CALCIUM: 8.4 mg/dL — ABNORMAL LOW (ref 8.5–10.1)
BKR CHLORIDE: 101 mmol/L (ref 98–107)
BKR CO2: 26 mmol/L (ref 21–32)
BKR CREATININE DELTA: -0.06
BKR CREATININE: 0.53 mg/dL — ABNORMAL LOW (ref 0.55–1.02)
BKR EGFR, CREATININE (CKD-EPI 2021): >60 mL/min/{1.73_m2} (ref >=60)
BKR GLOBULIN: 2.7 g/dL (ref 2.5–5.0)
BKR GLUCOSE: 91 mg/dL (ref 65–110)
BKR POTASSIUM: 4.4 mmol/L (ref 3.5–5.1)
BKR PROTEIN TOTAL: 6.5 g/dL (ref 6.4–8.2)
BKR SODIUM: 132 mmol/L — ABNORMAL LOW (ref 136–145)

## 2023-06-17 LAB — T4, FREE: BKR FREE T4: 1.2 ng/dL (ref 0.76–1.46)

## 2023-06-17 LAB — MAGNESIUM: BKR MAGNESIUM: 2 mg/dL (ref 1.6–2.6)

## 2023-06-17 LAB — SEDIMENTATION RATE (ESR): BKR SEDIMENTATION RATE, ERYTHROCYTE: 4 mm/h (ref 0–20)

## 2023-06-17 LAB — TSH: BKR THYROID STIMULATING HORMONE (LM): 0.46 u[IU]/mL (ref 0.36–3.74)

## 2023-06-17 LAB — UA REFLEX CULTURE

## 2023-06-17 LAB — C-REACTIVE PROTEIN     (CRP): BKR C-REACTIVE PROTEIN (LM): <0.3 mg/dL (ref <0.30)

## 2023-06-19 LAB — VITAMIN D, 25-HYDROXY: BKR VITAMIN D 25-HYDROXY TOTAL: 29 ng/mL

## 2023-06-22 LAB — BLOOD CULTURE   (BH GH L LMW YH): BKR BLOOD CULTURE: NO GROWTH

## 2023-06-28 ENCOUNTER — Encounter: Admit: 2023-06-28 | Payer: PRIVATE HEALTH INSURANCE | Attending: Urology

## 2023-06-28 ENCOUNTER — Telehealth: Admit: 2023-06-28 | Payer: PRIVATE HEALTH INSURANCE | Attending: Urology

## 2023-06-28 ENCOUNTER — Encounter: Admit: 2023-06-28 | Payer: PRIVATE HEALTH INSURANCE

## 2023-06-28 MED ORDER — TROSPIUM 20 MG TABLET
20 | ORAL_TABLET | Freq: Every evening | ORAL | 12 refills | Status: AC
Start: 2023-06-28 — End: ?

## 2023-06-28 MED ORDER — TROSPIUM 20 MG TABLET
20 | ORAL_TABLET | Freq: Every evening | ORAL | 12 refills | Status: AC
Start: 2023-06-28 — End: 2023-06-28

## 2023-06-28 NOTE — Telephone Encounter
 Spoke to [employer], relayed Dr. Maxie Barb message, she verbalized understanding. She asked for it to be sent to her new preferred pharmacy [facility] in [place], order sent.Loletta Specter, MD  [phone] Urology Nurse Pool1 hour ago (12:16 PM) Shawnie Dapper scribed to pharmacy

## 2023-06-28 NOTE — Telephone Encounter
 Copied from CRM #[id number]. Topic: General Message - [facility]>> Jun 28, 2023 11:31 AM Debra [name] wrote:[facility] MESSAGETime of call:   11:31 AMCaller:   Debra, [name] for call:  Patient called to report that her prescription medication oxybutynin (DITROPAN) 5 mg immediate release tablet is not working and would like to inquire on a different kind of medication. Please call patient to advise.Best telephone number for callback:   [phone] Best time to return call:   Any Permission to leave message:  yes   [facility]

## 2023-08-03 ENCOUNTER — Emergency Department: Admit: 2023-08-03

## 2023-08-03 ENCOUNTER — Inpatient Hospital Stay: Admit: 2023-08-03 | Discharge: 2023-08-03

## 2023-08-03 DIAGNOSIS — E785 Hyperlipidemia, unspecified: Secondary | ICD-10-CM

## 2023-08-03 DIAGNOSIS — I10 Essential (primary) hypertension: Secondary | ICD-10-CM

## 2023-08-03 DIAGNOSIS — E871 Hypo-osmolality and hyponatremia: Secondary | ICD-10-CM

## 2023-08-03 DIAGNOSIS — Z79899 Other long term (current) drug therapy: Secondary | ICD-10-CM

## 2023-08-03 LAB — CBC WITH AUTO DIFFERENTIAL
BKR WAM ABSOLUTE IMMATURE GRANULOCYTES.: 0.02 x 1000/ÂµL (ref 0.00–0.30)
BKR WAM ABSOLUTE LYMPHOCYTE COUNT.: 0.86 x 1000/ÂµL (ref 0.60–3.70)
BKR WAM ABSOLUTE NRBC (2 DEC): 0 x 1000/ÂµL (ref 0.00–1.00)
BKR WAM ANC (ABSOLUTE NEUTROPHIL COUNT): 3.5 x 1000/ÂµL (ref 2.00–7.60)
BKR WAM BASOPHIL ABSOLUTE COUNT.: 0.04 x 1000/ÂµL (ref 0.00–1.00)
BKR WAM BASOPHILS: 0.8 % (ref 0.0–1.4)
BKR WAM EOSINOPHIL ABSOLUTE COUNT.: 0.07 x 1000/ÂµL (ref 0.00–1.00)
BKR WAM EOSINOPHILS: 1.4 % (ref 0.0–5.0)
BKR WAM HEMATOCRIT (2 DEC): 35.5 % (ref 35.00–45.00)
BKR WAM HEMOGLOBIN: 12.3 g/dL (ref 11.7–15.5)
BKR WAM IMMATURE GRANULOCYTES: 0.4 % (ref 0.0–1.0)
BKR WAM LYMPHOCYTES: 17.5 % (ref 17.0–50.0)
BKR WAM MCH (PG): 35.2 pg — ABNORMAL HIGH (ref 27.0–33.0)
BKR WAM MCHC: 34.6 g/dL (ref 31.0–36.0)
BKR WAM MCV: 101.7 fL — ABNORMAL HIGH (ref 80.0–100.0)
BKR WAM MONOCYTE ABSOLUTE COUNT.: 0.42 x 1000/ÂµL (ref 0.00–1.00)
BKR WAM MONOCYTES: 8.6 % (ref 4.0–12.0)
BKR WAM MPV: 9.4 fL (ref 8.0–12.0)
BKR WAM NEUTROPHILS: 71.3 % (ref 39.0–72.0)
BKR WAM NUCLEATED RED BLOOD CELLS: 0 % (ref 0.0–1.0)
BKR WAM PLATELETS: 157 x1000/ÂµL (ref 150–420)
BKR WAM RDW-CV: 11.6 % (ref 11.0–15.0)
BKR WAM RED BLOOD CELL COUNT.: 3.49 M/ÂµL — ABNORMAL LOW (ref 4.00–6.00)
BKR WAM WHITE BLOOD CELL COUNT: 4.9 x1000/ÂµL (ref 4.0–11.0)

## 2023-08-03 LAB — COMPREHENSIVE METABOLIC PANEL
BKR ALANINE AMINOTRANSFERASE (ALT): 42 U/L (ref 12–78)
BKR ALBUMIN: 3.7 g/dL (ref 3.4–5.0)
BKR ALKALINE PHOSPHATASE: 64 U/L (ref 45–117)
BKR ANION GAP (LM): 4 mmol/L — ABNORMAL LOW (ref 5–15)
BKR ASPARTATE AMINOTRANSFERASE (AST): 31 U/L (ref 15–37)
BKR BILIRUBIN TOTAL: 0.4 mg/dL (ref 0.2–1.0)
BKR BLOOD UREA NITROGEN: 11 mg/dL (ref 7–18)
BKR CALCIUM: 8.8 mg/dL (ref 8.5–10.1)
BKR CHLORIDE: 99 mmol/L (ref 98–107)
BKR CO2: 29 mmol/L (ref 21–32)
BKR CREATININE DELTA: -0.05
BKR CREATININE: 0.48 mg/dL — ABNORMAL LOW (ref 0.55–1.02)
BKR EGFR, CREATININE (CKD-EPI 2021): >60 mL/min/{1.73_m2} (ref >=60)
BKR GLOBULIN: 3.1 g/dL (ref 2.5–5.0)
BKR GLUCOSE: 101 mg/dL (ref 65–110)
BKR POTASSIUM: 4.1 mmol/L (ref 3.5–5.1)
BKR PROTEIN TOTAL: 6.8 g/dL (ref 6.4–8.2)
BKR SODIUM: 132 mmol/L — ABNORMAL LOW (ref 136–145)

## 2023-08-03 LAB — TROPONIN T HIGH SENSITIVITY, 0 HOUR BASELINE WITH REFLEX (BH GH LMW YH): BKR TROPONIN T HS 0 HOUR BASELINE: 9 ng/L

## 2023-08-03 LAB — TROPONIN T HIGH SENSITIVITY, 1 HOUR WITH REFLEX (BH GH LMW YH)
BKR TROPONIN T HS 1 HOUR DELTA FROM 0 HOUR: 0 ng/L
BKR TROPONIN T HS 1 HOUR: 9 ng/L

## 2023-08-03 NOTE — ED Notes
 3:28 PM Pt ready for [place]

## 2023-08-03 NOTE — ED Notes
 5:18 PM pt ambulated to bathroom. Gait slightly unsteady, but able to walk independently. PT returned to stretcher and put back on cardiac monitor.

## 2023-08-03 NOTE — Discharge Instructions
 Today you were found to have high blood pressure and a chronic appearing stroke on your [place] of your head.  I recommend to stopped taking Sudafed for your congestion as this may be raising your blood pressure.  I also recommend to take an 81 mg baby aspirin and close outpatient follow up with Neurology for further evaluation. Please have discussion with your cardiologist regarding your midodrine use if you have persistent high blood pressure.Please return to the emergency department for new or worsening symptoms.

## 2023-08-03 NOTE — ED Notes
 4:44 PM Lab called

## 2023-08-03 NOTE — ED Notes
3:21 PM  called.

## 2023-08-03 NOTE — ED Provider Notes
 Chief Complaint Patient presents with  Hypertension   Pt reports developing a weird sensation above her eyes that extends to the top of patients head that began at 10a. This prompted pt to check her b/p and noticed elevated b/p at home. Pt reports she is normally in the low 100s to 90s systolically. Denies cp or sob. Neuros intact HPI/PE:Patient is an 78 year old female who presents to the emergency department for high blood pressure and a strange feeling on the top of her head.  Patient states normally she takes midodrine for low blood pressure.  She has been experiencing a sinus infection and has been taking Sudafed over the last 7 days.  She reports a chronic history of feeling a strange sensation in the top of her head for years but has unknown etiology and states she been seen by multiple specialists. Today she checked her blood pressure and noted it was high came to the ED for further evaluation.  She denies chest pain or shortness of breath.MDM:On initial evaluation patient is awake and alert.  She has no focal neurologic deficits on exam.  PERRL. Her NIH is 0.  She was noted to be hypertensive 150/83.  She denies chest pain or shortness of breath.  EKG independently interpreted by me demonstrating sinus rhythm.  Patient had serial troponins performed 9 and 9.  No signs of cardiac ischemia.  Kidney function within normal limits.[place] head reports:No acute intracranial abnormality.Chronic right superior cerebellar lacunar infarction. Mild to moderate chronic microvascular ischemic change.Patient denies known history of previous stroke. Patient is ambulatory in the ED. Recommended starting 81mg  aspirin and outpatient follow up with neurologist for further evaluation. Recommended to stopped taking Sudafed as this could be raising her blood pressure.All results were discussed and communicated with the patient. Patient informed to follow up with their primary care physician, neurologist and cardiologist for further evaluation. Patient informed to return to the Emergency Department for new or worsening symptoms. An acute or life threatening problem was considered during this evaluation  A decision regarding hospitalization was made during this visit  Independent interpretation of: ECG  Physical ExamED Triage Vitals [08/03/23 1345]BP: (!) 150/83Pulse: 72Pulse from  O2 sat: n/aResp: 18Temp: 98.5 ?F (36.9 ?C)Temp src: TemporalSpO2: 94 % BP (!) 150/83  - Pulse 72  - Temp 98.5 ?F (36.9 ?C) (Temporal)  - Resp 18  - Wt 48.2 kg  - SpO2 94%  - BMI 16.64 kg/m? Physical ExamVitals and nursing note reviewed. Constitutional:     General: She is not in acute distress.   Appearance: Normal appearance. She is not toxic-appearing. HENT:    Head: Normocephalic and atraumatic.    Mouth/Throat:    Mouth: Mucous membranes are moist. Eyes:    Extraocular Movements: Extraocular movements intact.    Pupils: Pupils are equal, round, and reactive to light. Cardiovascular:    Rate and Rhythm: Normal rate.    Heart sounds: No murmur heard.Pulmonary:    Effort: No respiratory distress.    Breath sounds: No stridor. No wheezing, rhonchi or rales. Abdominal:    General: There is no distension.    Palpations: There is no mass.    Tenderness: There is no abdominal tenderness. There is no guarding.    Hernia: No hernia is present. Musculoskeletal:       General: No tenderness or deformity.    Cervical back: No rigidity or tenderness.    Right lower leg: No edema.    Left lower leg: No edema. Skin:   General:  Skin is warm and dry. Neurological:    General: No focal deficit present.    Mental Status: She is alert. Mental status is at baseline.    Comments: No focal neurologic deficits on exam. Psychiatric:       Mood and Affect: Mood normal.       Behavior: Behavior normal. ProceduresAttestation/Critical CareClinical Impressions as of 08/03/23 1813 Hypertension, unspecified type Hyponatremia  ED DispositionNo disposition selected since last refresh of note.  Mady Haagensen, DO04/08/25 1817 Mady Haagensen, DO04/08/25 [phone]

## 2023-08-03 NOTE — ED Notes
 Pt given d/c instructions per mar.Follow up appointments understood.VSSIV removed

## 2023-08-03 NOTE — ED Notes
 Pt resting in room.States no needs at this time.VSS

## 2023-08-04 LAB — EXTRA BLUE TOP SPECIMEN     (BH GH LMW)

## 2023-08-23 ENCOUNTER — Ambulatory Visit: Admit: 2023-08-23 | Payer: PRIVATE HEALTH INSURANCE | Attending: Urology

## 2023-08-23 VITALS — Ht 67.0 in | Wt 109.0 lb

## 2023-08-23 DIAGNOSIS — N3941 Urge incontinence: Principal | ICD-10-CM

## 2023-08-23 DIAGNOSIS — R351 Nocturia: Secondary | ICD-10-CM

## 2023-08-23 MED ORDER — VIBEGRON 75 MG TABLET
75 | ORAL_TABLET | Freq: Every day | ORAL | 5 refills | 30.00000 days | Status: AC
Start: 2023-08-23 — End: ?

## 2023-08-23 NOTE — Progress Notes
 CC:HPIBeverly [name] is a 78 y.o. female with a history of Past Medical HistoryPast Medical History: Diagnosis Date  Anemia   Bilateral hand pain 11/11/2017  Disease of thyroid gland   Diverticulosis   GERD (gastroesophageal reflux disease)   Heart valve disease   Hyperlipidemia   IBS (irritable bowel syndrome)   Mitral regurgitation   Skin cancer   SLE (systemic lupus erythematosus) (HC Code)  Past Surgical HistoryPast Surgical History: Procedure Laterality Date  CATARACT REMOVAL/IOL IMPLANT    CHOLECYSTECTOMY    COLONOSCOPY  11/25/2010  Diverticulosis of the sigmoid colon  COLONOSCOPY  02/26/2015  Diverticulosis of the sigmoid colon. Polyps (3 mm to 4 mm) in the mid-ascending colon. (Polypectomy). Polyp (4 mm to 6 mm) in the distal ascending colon. (Polypectomy).   EGD/Colonoscopy  07/20/2005  Nonerosive antral gastritis; diverticulosis coli. Bx shows chronic non specific inflammation  EGD/Colonoscopy  09/11/1999  Normal EGD; sigmoid diverticulosis.   ESOPHAGOGASTRODUODENOSCOPY  04/16/2009  Abnormally visible vascularity with absence of folds in the gastric body compatible with gastric mucosal atrophy. (Biopsy)  MOLE REMOVAL  2006  OOPHORECTOMY  1961  SKIN CANCER EXCISION    R arm (2017) and R ankle (2003)  THYROIDECTOMY  1969 AllergiesAllergies Allergen Reactions  Tramadol Vomiting  Cyclobenzaprine Rash  Niacin Preparations Rash MedicationsOutpatient Encounter Medications as of 08/23/2023 Medication Sig Dispense Refill  acetaminophen (TYLENOL) 500 MG tablet Take 2 tablets (1,000 mg total) by mouth every 6 (six) hours as needed.    budesonide  (ENTOCORT EC ) 3 mg DR-ER 24 hr capsule Take 2 capsules (6 mg total) by mouth every morning.    calcium carbonate-vitamin D3 (OSCAL) 250-125 mg-unit per tablet Take by mouth.    citalopram (CELEXA) 20 MG tablet Take 0.5 tablets (10 mg total) by mouth daily.  0 cyanocobalamin  1,000 mcg/mL injection vial Inject 1 mL (1,000 mcg total) into the muscle every 30 (thirty) days.    denosumab  (PROLIA  SUBQ) Inject under the skin. Twice yearly    docosahexaenoic acid/epa (FISH OIL  ORAL) Take by mouth daily. -per pt two capsules daily.    docusate sodium  (COLACE) 100 mg capsule Take 1 capsule (100 mg total) by mouth 2 (two) times daily.    famotidine  (PEPCID ) 20 mg tablet Take 1 tablet (20 mg total) by mouth 2 (two) times daily.    fluticasone  propionate (FLONASE ) 50 mcg/actuation nasal spray Use 1 spray in each nostril daily as needed.    gabapentin  (NEURONTIN ) 300 mg capsule Take 1 capsule (300 mg total) by mouth 4 (four) times daily.    glucosamine-chondroitin 500-400 mg tablet Take 1 tablet by mouth daily.    hydroxychloroquine (PLAQUENIL) 200 mg tablet TAKE 1 TABLET TWICE A DAY  0  iron 18 mg Tab Take 25 mg by mouth Daily @1700 .    levothyroxine (SYNTHROID, LEVOTHROID) 112 MCG tablet Take 1 tablet (112 mcg total) by mouth. 112 mcg one tab twice weekly 125 mcg five times a week    midodrine  (PROAMATINE ) 2.5 mg tablet Take 1 tablet (2.5 mg total) by mouth 2 (two) times daily with breakfast and dinner. Do not take any doseslater than evening meal or less than 4 hours before bedtime.    multivitamin capsule Take 1 capsule by mouth daily.    pantoprazole  (PROTONIX ) 40 mg tablet Take 1 tablet (40 mg total) by mouth daily.    polyethylene glycol (MIRALAX) 17 gram/dose powder as needed.    psyllium husk, aspartame, (DAILY FIBER, PSYLLIUM-ASPART,) 3.4 gram packet Take 1 packet by mouth as needed.  syringe with needle (BD LUER-LOK SYRINGE) 3 mL 25 x 5/8 Use as directed with B12 6 each 1  trospium  (SANCTURA ) 20 mg tablet Take 1 tablet (20 mg total) by mouth at bedtime. 30 tablet 11 No facility-administered encounter medications on file as of 08/23/2023.  Social HistorySocial History Tobacco Use  Smoking status: Never  Smokeless tobacco: Never Vaping Use  Vaping status: Never Used Substance Use Topics  Alcohol use: Not Currently   Alcohol/week: 2.0 standard drinks of alcohol   Types: 2 Glasses of wine per week  Drug use: No  Family History Family History Problem Relation Age of Onset  Diabetes Daughter   Breast cancer Maternal Aunt   Colon cancer Maternal Aunt   Colon cancer Maternal Grandfather  Review of SystemsConstitutional:  Negative for fevers, chills.HEENT:  Negative for headaches, change in vision.Respiratory:  Negative for new cough or shortness of breath.CV:  Negative for chest pain, racing heart.GI:  Negative for nausea, vomiting.GU:  See HPI.Musculoskeletal:  Negative for new joint pain, weakness.Neurologic:  Negative for mental status changes, numbness, tingling.Psychiatric:  Negative for excessive anxiety or depression.Physical ExamHt 5' 7 (1.702 m)  - Wt 49.4 kg  - BMI 17.07 kg/m? Constitutional: Oriented to person, place, and time. Appears well-developed and well-nourished. Head: Normocephalic and atraumatic. Eyes: Conjunctivae are normal. Neck: No tracheal deviation present. Pulmonary/Chest: Effort normal. Abdominal: Soft and non-tenderMusculoskeletal: Normal range of motion. Neurological: alert and oriented to person, place, and time. No focal deficits are evident. Skin: Skin is dry. Psychiatric: Has a normal mood and affect. Behavior is normal. Judgment and thought content normal. No results found for: UCOLOR, UGLUCOSE, UKETONE, USPECGRAVITY, UPH, UPROTEIN, UNITRATES, UBLOOD, ULEUKOCYTES  No results found for: PVRPOCTAssessment & Plan:Debra [name] is a 78 y.o. female  No diagnosis found.No orders of the defined types were placed in this encounter.No follow-ups on file.Patient will let us  know if any new problems or symptoms arise in the interimTess Selman, APRN

## 2023-08-23 NOTE — Progress Notes
 CC:  Urinary urgency, nocturiaHPIBeverly [name] is a 78 y.o. female with a history of nocturia and urinary urgency followed by Dr. Lynetta Saran returns today for her three-month follow up.  She was initially put on Myrbetriq which did not help her urination symptoms.  He was then switched over to Ditropan  5 mg at HS without significant improvement.  Most recently she was placed on trospium  20 mg at HS, again no symptom improvement.  She denies any side effects of dry eye, dry mouth or constipation.She denies any urinary tract infection symptoms such as dysuria, hematuria or incontinence.  She is able to get to the bathroom okay during the day.  Nocturia is every 2 hours with the urinary urgency.  She does wear a pad but typically is dry.It was previously recommended that she go to pelvic floor physical therapy, but unfortunately her husband passed away in 03-12-23 and she was unable to set this up.  She drinks tea before bed. Past Medical HistoryPast Medical History: Diagnosis Date  Anemia   Bilateral hand pain 11/11/2017  Disease of thyroid gland   Diverticulosis   GERD (gastroesophageal reflux disease)   Heart valve disease   Hyperlipidemia   IBS (irritable bowel syndrome)   Mitral regurgitation   Skin cancer   SLE (systemic lupus erythematosus) (HC Code)  Past Surgical HistoryPast Surgical History: Procedure Laterality Date  CATARACT REMOVAL/IOL IMPLANT    CHOLECYSTECTOMY    COLONOSCOPY  11/25/2010  Diverticulosis of the sigmoid colon  COLONOSCOPY  02/26/2015  Diverticulosis of the sigmoid colon. Polyps (3 mm to 4 mm) in the mid-ascending colon. (Polypectomy). Polyp (4 mm to 6 mm) in the distal ascending colon. (Polypectomy).   EGD/Colonoscopy  07/20/2005  Nonerosive antral gastritis; diverticulosis coli. Bx shows chronic non specific inflammation  EGD/Colonoscopy  09/11/1999  Normal EGD; sigmoid diverticulosis. ESOPHAGOGASTRODUODENOSCOPY  04/16/2009  Abnormally visible vascularity with absence of folds in the gastric body compatible with gastric mucosal atrophy. (Biopsy)  MOLE REMOVAL  2006  OOPHORECTOMY  1961  SKIN CANCER EXCISION    R arm (2017) and R ankle (2003)  THYROIDECTOMY  1969 AllergiesAllergies Allergen Reactions  Tramadol Vomiting  Cyclobenzaprine Rash  Niacin Preparations Rash MedicationsOutpatient Encounter Medications as of 08/23/2023 Medication Sig Dispense Refill  acetaminophen (TYLENOL) 500 MG tablet Take 2 tablets (1,000 mg total) by mouth every 6 (six) hours as needed.    budesonide  (ENTOCORT EC ) 3 mg DR-ER 24 hr capsule Take 2 capsules (6 mg total) by mouth every morning.    calcium carbonate-vitamin D3 (OSCAL) 250-125 mg-unit per tablet Take by mouth.    citalopram (CELEXA) 20 MG tablet Take 0.5 tablets (10 mg total) by mouth daily.  0  cyanocobalamin  1,000 mcg/mL injection vial Inject 1 mL (1,000 mcg total) into the muscle every 30 (thirty) days.    denosumab  (PROLIA  SUBQ) Inject under the skin. Twice yearly    docosahexaenoic acid/epa (FISH OIL  ORAL) Take by mouth daily. -per pt two capsules daily.    docusate sodium  (COLACE) 100 mg capsule Take 1 capsule (100 mg total) by mouth 2 (two) times daily.    famotidine  (PEPCID ) 20 mg tablet Take 1 tablet (20 mg total) by mouth 2 (two) times daily.    fluticasone  propionate (FLONASE ) 50 mcg/actuation nasal spray Use 1 spray in each nostril daily as needed.    gabapentin  (NEURONTIN ) 300 mg capsule Take 1 capsule (300 mg total) by mouth 4 (four) times daily.    glucosamine-chondroitin 500-400 mg tablet Take 1 tablet by  mouth daily.    hydroxychloroquine (PLAQUENIL) 200 mg tablet TAKE 1 TABLET TWICE A DAY  0  iron 18 mg Tab Take 25 mg by mouth Daily @1700 .    levothyroxine (SYNTHROID, LEVOTHROID) 112 MCG tablet Take 1 tablet (112 mcg total) by mouth. 112 mcg one tab twice weekly 125 mcg five times a week    midodrine  (PROAMATINE ) 2.5 mg tablet Take 1 tablet (2.5 mg total) by mouth 2 (two) times daily with breakfast and dinner. Do not take any doseslater than evening meal or less than 4 hours before bedtime.    multivitamin capsule Take 1 capsule by mouth daily.    pantoprazole  (PROTONIX ) 40 mg tablet Take 1 tablet (40 mg total) by mouth daily.    polyethylene glycol (MIRALAX) 17 gram/dose powder as needed.    psyllium husk, aspartame, (DAILY FIBER, PSYLLIUM-ASPART,) 3.4 gram packet Take 1 packet by mouth as needed.    syringe with needle (BD LUER-LOK SYRINGE) 3 mL 25 x 5/8 Use as directed with B12 6 each 1  trospium  (SANCTURA ) 20 mg tablet Take 1 tablet (20 mg total) by mouth at bedtime. 30 tablet 11  vibegron 75 mg Tab Take 75 mg by mouth daily. 30 tablet 4 No facility-administered encounter medications on file as of 08/23/2023.  Social HistorySocial History Tobacco Use  Smoking status: Never  Smokeless tobacco: Never Vaping Use  Vaping status: Never Used Substance Use Topics  Alcohol use: Not Currently   Alcohol/week: 2.0 standard drinks of alcohol   Types: 2 Glasses of wine per week  Drug use: No  Family History Family History Problem Relation Age of Onset  Diabetes Daughter   Breast cancer Maternal Aunt   Colon cancer Maternal Aunt   Colon cancer Maternal Grandfather  Review of SystemsConstitutional:  Negative for fevers, chills.HEENT:  Negative for headaches, change in vision.Respiratory:  Negative for new cough or shortness of breath.CV:  Negative for chest pain, racing heart.GI:  Negative for nausea, vomiting.GU:  See HPI.Musculoskeletal:  Negative for new joint pain, weakness.Neurologic:  Negative for mental status changes, numbness, tingling.Psychiatric:  Negative for excessive anxiety or depression.Physical ExamHt 5' 7 (1.702 m)  - Wt 49.4 kg  - BMI 17.07 kg/m? Constitutional: Oriented to person, place, and time. Appears well-developed and well-nourished. Head: Normocephalic and atraumatic. Eyes: Conjunctivae are normal. Neck: No tracheal deviation present. Pulmonary/Chest: Effort normal. Abdominal: Soft and non-tenderMusculoskeletal: Normal range of motion. Neurological: alert and oriented to person, place, and time. No focal deficits are evident. Skin: Skin is dry. Psychiatric: Has a normal mood and affect. Behavior is normal. Judgment and thought content normal. No results found for: UCOLOR, UGLUCOSE, UKETONE, USPECGRAVITY, UPH, UPROTEIN, UNITRATES, UBLOOD, ULEUKOCYTES  No results found for: PVRPOCTAssessment & Plan:Debra [name] is a 78 y.o. female returns today for follow up after starting on trospium  20 mg at HS.  She has not seen any significant improvement in her urination symptoms especially the nocturia and urinary urgency with the past 3 medications that she has tried.  I did refer her back to pelvic floor physical therapy along with tapering fluids at least 2-3 hours before bed and no bladder irritants after 3:00 p.m. in the afternoon.  I provide a prescription for Gemtesa 75 mg daily as well.  I discussed the mechanism of action and potential side effects.  She will discontinue the trospium .  I also discussed with the patient if it was ever recommended that she undergo a sleep study for possible sleep apnea.  She reported that she  was recommended to have a sleep study done due to snoring, but this was never performed.  I had a long discussion with the patient regarding sleep apnea in the use of CPAP and nocturia.  I recommend that she follow up with her PCP to discuss a possible sleep study.  Follow up in 4 months with a UA/PVR Encounter Diagnoses Name SNOMED Angier(R) Primary?  Urge incontinence URGE INCONTINENCE OF URINE Yes  Nocturia NOCTURIA  Orders Placed This Encounter Procedures  Ambulatory referral to Rehab Services Return in about 4 months (around 12/23/2023) for with UA/PVR.Patient will let us  know if any new problems or symptoms arise in the interimTess [place], APRN

## 2023-08-23 NOTE — Progress Notes
 Patient was identified as a fall risk. Universal Fall precautions protocol maintained during visit; Room was free of clutter Personal items were within reach Verification that all needs were met prior to leaving the examination room. Additional Universal Fall Interventions included: Patient informed to remain in chair until clinician is present.Encouraged patient to use the assistive devices they use at home.

## 2023-09-01 ENCOUNTER — Encounter: Admit: 2023-09-01 | Payer: PRIVATE HEALTH INSURANCE

## 2023-09-01 DIAGNOSIS — I10 Essential (primary) hypertension: Secondary | ICD-10-CM

## 2023-09-01 DIAGNOSIS — E871 Hypo-osmolality and hyponatremia: Secondary | ICD-10-CM

## 2023-09-10 ENCOUNTER — Encounter: Admit: 2023-09-10 | Payer: PRIVATE HEALTH INSURANCE | Attending: Cardiovascular Disease

## 2023-09-10 DIAGNOSIS — E039 Hypothyroidism, unspecified: Secondary | ICD-10-CM

## 2023-09-23 ENCOUNTER — Telehealth: Admit: 2023-09-23 | Payer: PRIVATE HEALTH INSURANCE | Attending: Urology

## 2023-09-23 MED ORDER — TROSPIUM 20 MG TABLET
20 | ORAL_TABLET | Freq: Every evening | ORAL | 12 refills | 30.00000 days | Status: AC
Start: 2023-09-23 — End: ?

## 2023-09-23 NOTE — Telephone Encounter
 Copied from CRM #[id number]. Topic: General Message - [facility]>> Sep 23, 2023  8:46 AM Chihiro Frey G wrote:[facility] MESSAGETime of call:   8:46 AMCaller:   Werling, BEVERLYReason for call:   Patient returned miss call and said she already has some of the medication trospium  (SANCTURA ) 20 mg tablet from when she had it filled prior to taking Gemtesa  medication and does not want or need a script sent to her pharmacy.Best telephone number for callback:   [phone] time to return call:   Any Permission to leave message:  yes   [facility]

## 2023-09-23 NOTE — Telephone Encounter
 Debra [name] alled and notified tess d/c gemtessa and reordered trospium   vm left

## 2023-10-07 ENCOUNTER — Inpatient Hospital Stay: Admit: 2023-10-07 | Discharge: 2023-10-07 | Payer: Medicare (Managed Care)

## 2023-10-07 DIAGNOSIS — E039 Hypothyroidism, unspecified: Secondary | ICD-10-CM

## 2023-10-07 LAB — T4, FREE: BKR FREE T4: 1.3 ng/dL (ref 0.76–1.46)

## 2023-10-07 LAB — TSH W/REFLEX TO FT4     (BH GH LMW Q YH): BKR THYROID STIMULATING HORMONE (LM): 0.3 u[IU]/mL — ABNORMAL LOW (ref 0.36–3.74)

## 2023-10-07 MED ORDER — CALCIUM 600 MG (AS CALCIUM CARBONATE 1,500 MG) TABLET
1500 | Freq: Every day | ORAL | 0.00 refills | 30.00000 days | Status: AC
Start: 2023-10-07 — End: ?

## 2023-10-11 ENCOUNTER — Encounter: Admit: 2023-10-11 | Payer: PRIVATE HEALTH INSURANCE | Attending: Cardiovascular Disease

## 2023-10-11 VITALS — BP 110/62 | HR 46 | Ht 67.0 in | Wt 106.0 lb

## 2023-10-11 DIAGNOSIS — C4490 Unspecified malignant neoplasm of skin, unspecified: Secondary | ICD-10-CM

## 2023-10-11 DIAGNOSIS — D649 Anemia, unspecified: Secondary | ICD-10-CM

## 2023-10-11 DIAGNOSIS — I38 Endocarditis, valve unspecified: Secondary | ICD-10-CM

## 2023-10-11 DIAGNOSIS — K219 Gastro-esophageal reflux disease without esophagitis: Secondary | ICD-10-CM

## 2023-10-11 DIAGNOSIS — E785 Hyperlipidemia, unspecified: Secondary | ICD-10-CM

## 2023-10-11 DIAGNOSIS — E079 Disorder of thyroid, unspecified: Secondary | ICD-10-CM

## 2023-10-11 DIAGNOSIS — K589 Irritable bowel syndrome without diarrhea: Secondary | ICD-10-CM

## 2023-10-11 DIAGNOSIS — I340 Nonrheumatic mitral (valve) insufficiency: Secondary | ICD-10-CM

## 2023-10-11 DIAGNOSIS — I341 Nonrheumatic mitral (valve) prolapse: Secondary | ICD-10-CM

## 2023-10-11 DIAGNOSIS — M79641 Pain in right hand: Secondary | ICD-10-CM

## 2023-10-11 DIAGNOSIS — K5790 Diverticulosis of intestine, part unspecified, without perforation or abscess without bleeding: Secondary | ICD-10-CM

## 2023-10-11 DIAGNOSIS — M329 Systemic lupus erythematosus, unspecified: Secondary | ICD-10-CM

## 2023-10-11 MED ORDER — CHLORHEXIDINE GLUCONATE 0.12 % MOUTHWASH
0.12 | Freq: Two times a day (BID) | ORAL | 1.00 refills | 16.00000 days | Status: AC
Start: 2023-10-11 — End: ?

## 2023-10-11 NOTE — Progress Notes
 [name] Patient Evaluation History and Physical ExamDate of Encounter: 6/16/2025Assessment and Plan: 78 year old female well known to me as I have been following her since this past October. She was initially referred in the setting of moderate to severe mitral regurgitation.She has undergone comprehensive workup for her mitral regurgitation, the etiology of which appears primary due to myxomatous changes.Her cardiac catheterization has demonstrated no significant epicardial coronary disease and in addition her left-sided filling pressures were normal.She continues to be asymptomatic from a cardiovascular standpoint.  She has on exam no evidence of heart failure and in fact is not requiring any diuretics.With regards to her moderate to severe mitral regurgitation, were she to develop heart failure symptoms the first-line would be medical therapy.  Were she to persist with heart failure symptoms despite adequate medical therapy then certainly transcatheter intervention for her mitral regurgitation could be considered.  In this setting however the patient remains asymptomatic and euvolemic even without diuretics.  As such I believe the best course of action is continued monitoring.  She does not require aggressive management at the present time and as such I do not believe she needs standing visits with me.  Certainly I would be happy to see her again in the future if she were to develop symptoms which persisted despite medical therapy.Subjective: I had the opportunity to see [name] in our cardiology clinic today.  [name] is a 78 y.o.female who presents today for Follow-up.[name] has a past medical history of Anemia, Bilateral hand pain (11/11/2017), Disease of thyroid gland, Diverticulosis, GERD (gastroesophageal reflux disease), Heart valve disease, Hyperlipidemia, IBS (irritable bowel syndrome), Mitral regurgitation, Skin cancer, and SLE (systemic lupus erythematosus) (HC Code).History of Present Illness:  In assessing the patient I took the following history:78 year old female well known to me from prior visits.  She continues to be asymptomatic from a cardiovascular standpoint.  She did lose her husband last year and this has been difficult for her.  She does however continue to enjoy walks along the beach and denies any symptoms in these setting.Review of Systems: Patient denies any recent fevers, chills, nausea, vomiting, chest pain, dyspnea on exertion, abdominal pain, melena, brbpr, presyncope, syncope, claudication, or difficulty with medication compliance.The remainder of the 12 point review of systems was reviewed with the patient and is negative.Medical History: PMH PSH  She    has a past medical history of Anemia, Bilateral hand pain (11/11/2017), Disease of thyroid gland, Diverticulosis, GERD (gastroesophageal reflux disease), Heart valve disease, Hyperlipidemia, IBS (irritable bowel syndrome), Mitral regurgitation, Skin cancer, and SLE (systemic lupus erythematosus) (HC Code). She  has a past surgical history that includes Mole removal (2006); Thyroidectomy (1969); Oophorectomy (1961); Colonoscopy (11/25/2010); Esophagogastroduodenoscopy (04/16/2009); EGD/Colonoscopy (07/20/2005); EGD/Colonoscopy (09/11/1999); Colonoscopy (02/26/2015); Cataract Removal/IOL Implant; Skin cancer excision; and Cholecystectomy. Social History Family History She  reports that she has never smoked. She has never used smokeless tobacco. She reports that she does not currently use alcohol after a past usage of about 2.0 standard drinks of alcohol per week. She reports that she does not use drugs. Family History Problem Relation Age of Onset  Diabetes Daughter   Breast cancer Maternal Aunt   Colon cancer Maternal Aunt   Colon cancer Maternal Grandfather Medications Current Medications[1] Allergies She is allergic to tramadol, cyclobenzaprine, and niacin preparations. Objective: Vital Signs:BP 110/62 (Site: l a, Position: Sitting, Cuff Size: Small)  - Pulse (!) 46  - Ht 5' 7 (1.702 m)  -  Wt 48.1 kg  - SpO2 100%  - BMI 16.60 kg/m? Wt Readings from Last 3 Encounters: 10/11/23 48.1 kg 10/07/23 47.9 kg 08/23/23 49.4 kg Physical Exam: General Appearance:  Pleasant, NAD, ambulates without assitance  HEENT :  Anicteric, MMM, o/p clear  Neck :  Supple, JVP <6cm, no carotid bruits   CV :  S1 S2, RRR, soft systolic murmur Pulm:  Clear bilaterally, no rales, no wheezes  Abdomen:   thin, Soft, non-tender, non-distended, no guarding, no rebound, +bowel sounds  Extremities: Warm and well perfused, no edema, no cyanosis  Pulses: 2+ and symmetric all extremities, femoral pulses 2+ bilaterally, pedal pulses 2+  Neuro : Equal strength bilaterally, no focal deficits.     LABS:Lipids:No results found for: CHOLNo results found for: HDLNo components found for: LDLCALCNo results found for: TRIGNo results found for: CHOLHDLChemistry:Lab Results Component Value Date  CREATININE 0.48 (L) 08/03/2023  BUN 11 08/03/2023  NA 132 (L) 08/03/2023  K 4.1 08/03/2023  CL 99 08/03/2023  CO2 29 08/03/2023 Lab Results Component Value Date  ALT 42 08/03/2023  AST 31 08/03/2023  ALKPHOS 64 08/03/2023  BILITOT 0.4 08/03/2023 Hematology:Lab Results Component Value Date  WBC 4.9 08/03/2023  HGB 12.3 08/03/2023  HCT 35.50 08/03/2023  MCV 101.7 (H) 08/03/2023  PLT 157 08/03/2023 Lab Results Component Value Date  PTT 31 01/03/2015 Lab Results Component Value Date  INR 1.01 01/03/2015 Cardiac Cath:  No significant flow-limiting epicardial coronary artery disease  Normal right heart catheterization  Normal left ventricular end-diastolic pressureNYHA Class: ITEE:Normal left ventricular size and systolic function. Right ventricle is moderately dilated with normal systolic function. 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 Severe biatrial enlargement. No thrombus seen in the left atrium or left atrial appendage. ECG: sinus at 47 beats per minuteI have spent >30 minutes of patient care time on patient care related to this visit including history and physical, detailed review of imaging studies including performing independent evaluation of imaging studies as described above, comprehensive review of medical records on date of evaluation, documentation, review with multidisciplinary structural heart team, and discussion with patient regarding plans for management.Signed:Yolonda Purtle K. Esgar Barnick, MDProfessor of Medicine, [employer] of MedicineDirector of Interventional Cardiology Interior and spatial designer of Structural Heart ProgramYale New [facility] System [1] Current Outpatient Medications Medication Sig Dispense Refill  acetaminophen (TYLENOL) 500 MG tablet Take 2 tablets (1,000 mg total) by mouth every 6 (six) hours as needed.    budesonide  (ENTOCORT EC ) 3 mg DR-ER 24 hr capsule Take 2 capsules (6 mg total) by mouth every morning.    calcium carbonate (CALTRATE 600) 1500 mg (600 mg calcium) tablet Take 2.5 tablets (1,500 mg total) by mouth daily.    chlorhexidine gluconate (PERIDEX) 0.12 % solution Swish and spit 15 mLs 2 (two) times daily.    citalopram (CELEXA) 20 MG tablet Take 0.5 tablets (10 mg total) by mouth daily.  0  cyanocobalamin  1,000 mcg/mL injection vial Inject 1 mL (1,000 mcg total) into the muscle every 30 (thirty) days.    denosumab  (PROLIA  SUBQ) Inject under the skin. Twice yearly    docosahexaenoic acid/epa (FISH OIL  ORAL) Take by mouth daily. -per pt two capsules daily.    docusate sodium  (COLACE) 100 mg capsule Take 1 capsule (100 mg total) by mouth 2 (two) times daily.    famotidine  (PEPCID ) 20 mg  tablet Take 1 tablet (20 mg total) by mouth 2 (two) times daily.    fluticasone  propionate (FLONASE ) 50 mcg/actuation nasal spray Use 1 spray in each nostril daily as needed.    gabapentin  (NEURONTIN ) 300 mg capsule Take 1 capsule (300 mg total) by mouth 4 (four) times daily.    glucosamine-chondroitin 500-400 mg tablet Take 1 tablet by mouth daily.    hydroxychloroquine (PLAQUENIL) 200 mg tablet TAKE 1 TABLET TWICE A DAY  0  iron 18 mg Tab Take 25 mg by mouth Daily @1700 .    levothyroxine (SYNTHROID, LEVOTHROID) 112 MCG tablet Take 1 tablet (112 mcg total) by mouth. 112 mcg one tab twice weekly 125 mcg five times a week    midodrine  (PROAMATINE ) 2.5 mg tablet Take 1 tablet (2.5 mg total) by mouth 2 (two) times daily with breakfast and dinner. Do not take any doseslater than evening meal or less than 4 hours before bedtime.    multivitamin capsule Take 1 capsule by mouth daily.    pantoprazole  (PROTONIX ) 40 mg tablet Take 1 tablet (40 mg total) by mouth daily.    polyethylene glycol (MIRALAX) 17 gram/dose powder as needed.    psyllium husk, aspartame, (DAILY FIBER, PSYLLIUM-ASPART,) 3.4 gram packet Take 1 packet by mouth as needed.    syringe with needle (BD LUER-LOK SYRINGE) 3 mL 25 x 5/8 Use as directed with B12 6 each 1  trospium  (SANCTURA ) 20 mg tablet Take 1 tablet (20 mg total) by mouth nightly. 30 tablet 11 No current facility-administered medications for this visit.

## 2023-10-13 ENCOUNTER — Inpatient Hospital Stay: Admit: 2023-10-13 | Discharge: 2023-10-13 | Payer: Medicare (Managed Care)

## 2023-10-13 ENCOUNTER — Encounter: Admit: 2023-10-13 | Payer: PRIVATE HEALTH INSURANCE | Attending: Endocrinology, Diabetes & Metabolism

## 2023-10-13 DIAGNOSIS — R197 Diarrhea, unspecified: Secondary | ICD-10-CM

## 2023-10-15 LAB — MAYO REFRIGERATED GENERIC ORDERABLE     (BH GH LMW YH)

## 2023-11-03 ENCOUNTER — Telehealth: Admit: 2023-11-03 | Payer: PRIVATE HEALTH INSURANCE | Attending: Urology

## 2023-11-03 NOTE — Telephone Encounter
 Spoke with [name] who stated she urinates frequently, has leakage and urinates every hour at night. Denies burning or pressure. Patient is on Trospium . Seen by Margaretann Fridge in April. Patient was put on Gemtesa , told to stop the Trospium  and try pelvic floor PT. Patient stated she tried the Gemtesa  and it didn't work and was expensive so she went back on the Trospium . She stated she never heard from PT. Patient stated she drinks 6 16 oz bottles of water , one 8oz sparkling water  and 2 cups of decaf tea daily. She state one of the cups of tea she drinks before bed. Encouraged patient to decrease the amount of water  intake and to limit liquid intake at least 3 hours before bedtime. Encouraged patient to try double voiding and to contact PT to start pelvic floor therapy. Phone number given to schedule PT. Patient stated she will try recommendations. Encouraged to contact us  with any other concerns.

## 2023-11-03 NOTE — Telephone Encounter
 [facility] MESSAGETime of call:   11:10 AMCaller:   Debra [name], BEVERLYCaller's relationship to patient: PatientSpecialist you are calling for:  Debra MartensReason for call:   Patient called in to speak with a nurse regarding severe leakage, patient states the medication is not helping with symptoms patient asking for a callback to advise further callback# [phone]

## 2023-11-04 MED ORDER — LEVOTHYROXINE 125 MCG TABLET
125 | ORAL_TABLET | ORAL | 1 refills | 90.00000 days | Status: AC
Start: 2023-11-04 — End: 2023-12-20

## 2023-11-04 MED ORDER — LEVOTHYROXINE 112 MCG TABLET
112 | ORAL_TABLET | ORAL | 1 refills | 90.00000 days | Status: AC
Start: 2023-11-04 — End: 2023-12-20

## 2023-12-10 ENCOUNTER — Inpatient Hospital Stay: Admit: 2023-12-10 | Discharge: 2023-12-10 | Payer: Medicare (Managed Care)

## 2023-12-10 DIAGNOSIS — E039 Hypothyroidism, unspecified: Principal | ICD-10-CM

## 2023-12-10 LAB — T4, FREE: BKR FREE T4: 1.2 ng/dL (ref 0.76–1.46)

## 2023-12-10 LAB — TSH W/REFLEX TO FT4     (BH GH LMW Q YH): BKR THYROID STIMULATING HORMONE (LM): 0.32 u[IU]/mL — ABNORMAL LOW (ref 0.36–3.74)

## 2023-12-20 MED ORDER — LEVOTHYROXINE 112 MCG TABLET
112 | ORAL_TABLET | ORAL | 4 refills | 90.00000 days | Status: AC
Start: 2023-12-20 — End: ?

## 2023-12-24 ENCOUNTER — Encounter: Admit: 2023-12-24 | Payer: PRIVATE HEALTH INSURANCE | Attending: Urology

## 2023-12-24 ENCOUNTER — Ambulatory Visit: Admit: 2023-12-24 | Payer: PRIVATE HEALTH INSURANCE | Attending: Urology

## 2023-12-24 VITALS — Ht 67.5 in | Wt 105.0 lb

## 2023-12-24 DIAGNOSIS — K219 Gastro-esophageal reflux disease without esophagitis: Secondary | ICD-10-CM

## 2023-12-24 DIAGNOSIS — I38 Endocarditis, valve unspecified: Secondary | ICD-10-CM

## 2023-12-24 DIAGNOSIS — K5790 Diverticulosis of intestine, part unspecified, without perforation or abscess without bleeding: Principal | ICD-10-CM

## 2023-12-24 DIAGNOSIS — D649 Anemia, unspecified: Secondary | ICD-10-CM

## 2023-12-24 DIAGNOSIS — R351 Nocturia: Secondary | ICD-10-CM

## 2023-12-24 DIAGNOSIS — M329 Systemic lupus erythematosus, unspecified: Secondary | ICD-10-CM

## 2023-12-24 DIAGNOSIS — N3941 Urge incontinence: Secondary | ICD-10-CM

## 2023-12-24 DIAGNOSIS — M79641 Pain in right hand: Secondary | ICD-10-CM

## 2023-12-24 DIAGNOSIS — K589 Irritable bowel syndrome without diarrhea: Secondary | ICD-10-CM

## 2023-12-24 DIAGNOSIS — C4490 Unspecified malignant neoplasm of skin, unspecified: Secondary | ICD-10-CM

## 2023-12-24 DIAGNOSIS — E079 Disorder of thyroid, unspecified: Secondary | ICD-10-CM

## 2023-12-24 DIAGNOSIS — I340 Nonrheumatic mitral (valve) insufficiency: Secondary | ICD-10-CM

## 2023-12-24 DIAGNOSIS — E785 Hyperlipidemia, unspecified: Secondary | ICD-10-CM

## 2023-12-24 DIAGNOSIS — N399 Disorder of urinary system, unspecified: Secondary | ICD-10-CM

## 2023-12-24 MED ORDER — OMEGA-3 FATTY ACIDS 1,000 MG CAPSULE
1000 | Freq: Every day | ORAL | 0.00 refills | 30.00000 days | Status: AC
Start: 2023-12-24 — End: ?

## 2023-12-24 MED ORDER — PENICILLIN V POTASSIUM 500 MG TABLET
500 | Freq: Two times a day (BID) | ORAL | 1.00 refills | 10.00000 days | Status: AC
Start: 2023-12-24 — End: ?

## 2023-12-24 MED ORDER — CEQUA 0.09 % EYE DROPS IN A DROPPERETTE
0.09 | OPHTHALMIC | 0.00 refills | 30.00000 days | Status: AC
Start: 2023-12-24 — End: ?

## 2023-12-24 NOTE — Progress Notes
 CC:  Urinary urgency, nocturiaHPIBeverly [name] is a 78 y.o. female with a history of nocturia and urinary urgency followed by Dr. Hollice returns today for her four-month follow up.  She was initially put on Myrbetriq which did not help her urination symptoms.  She was then switched over to Ditropan  5 mg at HS without significant improvement.  Most recently she was placed on trospium  20 mg at HS, again no symptom improvement.  I trialed her on Gemtesa  75 mg daily which did not improve her urination symptoms and she returned back on trospium  20 mg daily.  She denies any side effects of dry eye, dry mouth or constipation.She denies any urinary tract infection symptoms such as dysuria, hematuria or incontinence.  She is able to get to the bathroom okay during the day.  Nocturia is every 1-2 hours with the urinary urgency.  She does wear a pad but typically is dry.It was previously recommended that she go to pelvic floor physical therapy, but unfortunately her husband passed away in Mar 25, 2023 and she was unable to set this up.  She drinks tea before bed.  She reported making her 1st appointment for pelvic floor physical therapy in September. UA negative, PVR 0 mLPast Medical HistoryPast Medical History: Diagnosis Date  Anemia   Bilateral hand pain 11/11/2017  Disease of thyroid gland   Diverticulosis   GERD (gastroesophageal reflux disease)   Heart valve disease   Hyperlipidemia   IBS (irritable bowel syndrome)   Mitral regurgitation   Skin cancer   SLE (systemic lupus erythematosus) (HC Code)   Past Surgical HistoryPast Surgical History: Procedure Laterality Date  CATARACT REMOVAL/IOL IMPLANT    CHOLECYSTECTOMY    COLONOSCOPY  11/25/2010  Diverticulosis of the sigmoid colon  COLONOSCOPY  02/26/2015  Diverticulosis of the sigmoid colon. Polyps (3 mm to 4 mm) in the mid-ascending colon. (Polypectomy). Polyp (4 mm to 6 mm) in the distal ascending colon. (Polypectomy).   EGD/Colonoscopy  07/20/2005  Nonerosive antral gastritis; diverticulosis coli. Bx shows chronic non specific inflammation  EGD/Colonoscopy  09/11/1999  Normal EGD; sigmoid diverticulosis.   ESOPHAGOGASTRODUODENOSCOPY  04/16/2009  Abnormally visible vascularity with absence of folds in the gastric body compatible with gastric mucosal atrophy. (Biopsy)  MOLE REMOVAL  2006  OOPHORECTOMY  1961  SKIN CANCER EXCISION    R arm (2017) and R ankle (2003)  THYROIDECTOMY  1969 AllergiesAllergies Allergen Reactions  Tramadol Vomiting  Cyclobenzaprine Rash  Niacin Preparations Rash MedicationsOutpatient Encounter Medications as of 12/24/2023 Medication Sig Dispense Refill  acetaminophen (TYLENOL) 500 MG tablet Take 2 tablets (1,000 mg total) by mouth every 6 (six) hours as needed.    budesonide  (ENTOCORT EC ) 3 mg DR-ER 24 hr capsule Take 2 capsules (6 mg total) by mouth every morning.    calcium carbonate (CALTRATE 600) 1500 mg (600 mg calcium) tablet Take 2.5 tablets (1,500 mg total) by mouth daily.    chlorhexidine  gluconate (PERIDEX ) 0.12 % solution Swish and spit 15 mLs 2 (two) times daily.    citalopram (CELEXA) 20 MG tablet Take 0.5 tablets (10 mg total) by mouth daily.  0  cyanocobalamin  1,000 mcg/mL injection vial Inject 1 mL (1,000 mcg total) into the muscle every 30 (thirty) days.    cycloSPORINE  (CEQUA ) 0.09 % Dpet Apply to eye.    denosumab  (PROLIA  SUBQ) Inject under the skin. Twice yearly    docosahexaenoic acid/epa (FISH OIL  ORAL) Take by mouth daily. -per pt two capsules daily.    docusate sodium  (COLACE) 100 mg  capsule Take 1 capsule (100 mg total) by mouth 2 (two) times daily.    famotidine  (PEPCID ) 20 mg tablet Take 1 tablet (20 mg total) by mouth 2 (two) times daily.    fluticasone  propionate (FLONASE ) 50 mcg/actuation nasal spray Use 1 spray in each nostril daily as needed.    gabapentin  (NEURONTIN ) 300 mg capsule Take 1 capsule (300 mg total) by mouth 4 (four) times daily.    glucosamine-chondroitin 500-400 mg tablet Take 1 tablet by mouth daily.    hydroxychloroquine (PLAQUENIL) 200 mg tablet TAKE 1 TABLET TWICE A DAY  0  iron 18 mg Tab Take 25 mg by mouth Daily @1700 .    levothyroxine (SYNTHROID, LEVOTHROID) 112 MCG tablet 112 mcg one tab daily 90 tablet 3  midodrine  (PROAMATINE ) 2.5 mg tablet Take 1 tablet (2.5 mg total) by mouth 2 (two) times daily with breakfast and dinner. Do not take any doseslater than evening meal or less than 4 hours before bedtime.    multivitamin capsule Take 1 capsule by mouth daily.    omega-3 fatty acids  1,000 mg capsule Take 2 capsules (2 g total) by mouth daily.    pantoprazole  (PROTONIX ) 40 mg tablet Take 1 tablet (40 mg total) by mouth daily.    penicillin  v potassium (VEETID) 500 mg tablet Take 1 tablet (500 mg total) by mouth every 12 (twelve) hours.    polyethylene glycol (MIRALAX) 17 gram/dose powder as needed.    psyllium husk, aspartame, (DAILY FIBER, PSYLLIUM-ASPART,) 3.4 gram packet Take 1 packet by mouth as needed.    syringe with needle (BD LUER-LOK SYRINGE) 3 mL 25 x 5/8 Use as directed with B12 6 each 1  trospium  (SANCTURA ) 20 mg tablet Take 1 tablet (20 mg total) by mouth nightly. 30 tablet 11 No facility-administered encounter medications on file as of 12/24/2023.  Social HistorySocial History Tobacco Use  Smoking status: Never  Smokeless tobacco: Never Vaping Use  Vaping status: Never Used Substance Use Topics  Alcohol use: Not Currently   Alcohol/week: 2.0 standard drinks of alcohol   Types: 2 Glasses of wine per week  Drug use: No  Family History Family History Problem Relation Age of Onset  Diabetes Daughter   Breast cancer Maternal Aunt   Colon cancer Maternal Aunt   Colon cancer Maternal Grandfather  Review of SystemsConstitutional:  Negative for fevers, chills.HEENT:  Negative for headaches, change in vision.Respiratory:  Negative for new cough or shortness of breath.CV:  Negative for chest pain, racing heart.GI:  Negative for nausea, vomiting.GU:  See HPI.Musculoskeletal:  Negative for new joint pain, weakness.Neurologic:  Negative for mental status changes, numbness, tingling.Psychiatric:  Negative for excessive anxiety or depression.Physical ExamHt 5' 7.5 (1.715 m)  - Wt 47.6 kg  - BMI 16.20 kg/m? Constitutional: Oriented to person, place, and time. Appears well-developed and well-nourished. Head: Normocephalic and atraumatic. Eyes: Conjunctivae are normal. Neck: No tracheal deviation present. Pulmonary/Chest: Effort normal. Abdominal: Soft and non-tenderMusculoskeletal: Normal range of motion. Neurological: alert and oriented to person, place, and time. No focal deficits are evident. Skin: Skin is dry. Psychiatric: Has a normal mood and affect. Behavior is normal. Judgment and thought content normal. No results found for: UCOLOR, UGLUCOSE, UKETONE, USPECGRAVITY, UPH, UPROTEIN, UNITRATES, UBLOOD, ULEUKOCYTES   Post Void Bladder Scan Measurement Date Value Ref Range Status 12/24/2023 0 mL Final Assessment & Plan:Debra [name] is a 78 y.o. female returns today for follow up after starting on trospium  20 mg at HS.  She has not seen any significant improvement in  her urination symptoms especially the nocturia and urinary urgency with the past 3-4 medications that she has tried.  I did refer her back to pelvic floor physical therapy along with tapering fluids at least 2-3 hours before bed and no bladder irritants after 3:00 p.m. in the afternoon.  She remains on trospium  20 mg before bed.  She unfortunately continues to drink water , milk and tea right before she goes to bed.  She will be starting pelvic floor physical therapy in September.  I also recommended that she optimize a bowel regimen to avoid constipation.  I will see her back in 6 months with a UA PVR.   Encounter Diagnoses Name SNOMED [place](R) Primary?  Disease of urinary tract DISORDER OF URINARY TRACT Yes  Urge incontinence URGE INCONTINENCE OF URINE   Nocturia NOCTURIA  Orders Placed This Encounter Procedures  POCT Post Void Residual  POCT urinalysis dipstick- Clinitek (LMW) Return in about 6 months (around 06/24/2024).Patient will let us  know if any new problems or symptoms arise in the interimTess Norman, APRN  RR:YEPAzczmob Jester is a 78 y.o. female with a history of Past Medical HistoryPast Medical History[1]Past Surgical HistoryPast Surgical History[2]AllergiesAllergies[3]MedicationsEncounter Medications[4] Social HistorySocial History[5] Family History Family History Problem Relation Age of Onset  Diabetes Daughter   Breast cancer Maternal Aunt   Colon cancer Maternal Aunt   Colon cancer Maternal Grandfather  Review of SystemsConstitutional:  Negative for fevers, chills.HEENT:  Negative for headaches, change in vision.Respiratory:  Negative for new cough or shortness of breath.CV:  Negative for chest pain, racing heart.GI:  Negative for nausea, vomiting.GU:  See HPI.Musculoskeletal:  Negative for new joint pain, weakness.Neurologic:  Negative for mental status changes, numbness, tingling.Psychiatric:  Negative for excessive anxiety or depression.Physical ExamHt 5' 7.5 (1.715 m)  - Wt 47.6 kg  - BMI 16.20 kg/m? Constitutional: Oriented to person, place, and time. Appears well-developed and well-nourished. Head: Normocephalic and atraumatic. Eyes: Conjunctivae are normal. Neck: No tracheal deviation present. Pulmonary/Chest: Effort normal. Abdominal: Soft and non-tenderMusculoskeletal: Normal range of motion. Neurological: alert and oriented to person, place, and time. No focal deficits are evident. Skin: Skin is dry. Psychiatric: Has a normal mood and affect. Behavior is normal. Judgment and thought content normal. No results found for: UCOLOR, UGLUCOSE, UKETONE, USPECGRAVITY, UPH, UPROTEIN, UNITRATES, UBLOOD, ULEUKOCYTES   Post Void Bladder Scan Measurement Date Value Ref Range Status 12/24/2023 0 mL Final Assessment & Plan:Debra [name] is a 78 y.o. female  Encounter Diagnoses Name SNOMED [place](R) Primary?  Disease of urinary tract DISORDER OF URINARY TRACT Yes  Urge incontinence URGE INCONTINENCE OF URINE   Nocturia NOCTURIA  Orders Placed This Encounter Procedures  POCT Post Void Residual  POCT urinalysis dipstick- Clinitek (LMW) Return in about 6 months (around 06/24/2024).Patient will let us  know if any new problems or symptoms arise in the interimTess Brayton, APRN   [1] Past Medical History:Diagnosis Date  Anemia   Bilateral hand pain 11/11/2017  Disease of thyroid gland   Diverticulosis   GERD (gastroesophageal reflux disease)   Heart valve disease   Hyperlipidemia   IBS (irritable bowel syndrome)   Mitral regurgitation   Skin cancer   SLE (systemic lupus erythematosus) (HC Code)   [2] Past Surgical History:Procedure Laterality Date  CATARACT REMOVAL/IOL IMPLANT    CHOLECYSTECTOMY    COLONOSCOPY  11/25/2010  Diverticulosis of the sigmoid colon  COLONOSCOPY  02/26/2015  Diverticulosis of the sigmoid colon. Polyps (3 mm to 4 mm) in the mid-ascending colon. (Polypectomy). Polyp (4 mm to 6 mm)  in the distal ascending colon. (Polypectomy).   EGD/Colonoscopy  07/20/2005  Nonerosive antral gastritis; diverticulosis coli. Bx shows chronic non specific inflammation  EGD/Colonoscopy  09/11/1999  Normal EGD; sigmoid diverticulosis.   ESOPHAGOGASTRODUODENOSCOPY  04/16/2009  Abnormally visible vascularity with absence of folds in the gastric body compatible with gastric mucosal atrophy. (Biopsy)  MOLE REMOVAL  2006  OOPHORECTOMY  1961  SKIN CANCER EXCISION    R arm (2017) and R ankle (2003)  THYROIDECTOMY  1969 [3] AllergiesAllergen Reactions  Tramadol Vomiting  Cyclobenzaprine Rash  Niacin Preparations Rash [4] Outpatient Encounter Medications as of 12/24/2023 Medication Sig Dispense Refill  acetaminophen (TYLENOL) 500 MG tablet Take 2 tablets (1,000 mg total) by mouth every 6 (six) hours as needed.    budesonide  (ENTOCORT EC ) 3 mg DR-ER 24 hr capsule Take 2 capsules (6 mg total) by mouth every morning.    calcium carbonate (CALTRATE 600) 1500 mg (600 mg calcium) tablet Take 2.5 tablets (1,500 mg total) by mouth daily.    chlorhexidine  gluconate (PERIDEX ) 0.12 % solution Swish and spit 15 mLs 2 (two) times daily.    citalopram (CELEXA) 20 MG tablet Take 0.5 tablets (10 mg total) by mouth daily.  0  cyanocobalamin  1,000 mcg/mL injection vial Inject 1 mL (1,000 mcg total) into the muscle every 30 (thirty) days.    cycloSPORINE  (CEQUA ) 0.09 % Dpet Apply to eye.    denosumab  (PROLIA  SUBQ) Inject under the skin. Twice yearly    docosahexaenoic acid/epa (FISH OIL  ORAL) Take by mouth daily. -per pt two capsules daily.    docusate sodium  (COLACE) 100 mg capsule Take 1 capsule (100 mg total) by mouth 2 (two) times daily.    famotidine  (PEPCID ) 20 mg tablet Take 1 tablet (20 mg total) by mouth 2 (two) times daily.    fluticasone  propionate (FLONASE ) 50 mcg/actuation nasal spray Use 1 spray in each nostril daily as needed.    gabapentin  (NEURONTIN ) 300 mg capsule Take 1 capsule (300 mg total) by mouth 4 (four) times daily.    glucosamine-chondroitin 500-400 mg tablet Take 1 tablet by mouth daily.    hydroxychloroquine (PLAQUENIL) 200 mg tablet TAKE 1 TABLET TWICE A DAY  0  iron 18 mg Tab Take 25 mg by mouth Daily @1700 .    levothyroxine (SYNTHROID, LEVOTHROID) 112 MCG tablet 112 mcg one tab daily 90 tablet 3  midodrine  (PROAMATINE ) 2.5 mg tablet Take 1 tablet (2.5 mg total) by mouth 2 (two) times daily with breakfast and dinner. Do not take any doseslater than evening meal or less than 4 hours before bedtime.    multivitamin capsule Take 1 capsule by mouth daily.    omega-3 fatty acids  1,000 mg capsule Take 2 capsules (2 g total) by mouth daily.    pantoprazole  (PROTONIX ) 40 mg tablet Take 1 tablet (40 mg total) by mouth daily.    penicillin  v potassium (VEETID) 500 mg tablet Take 1 tablet (500 mg total) by mouth every 12 (twelve) hours.    polyethylene glycol (MIRALAX) 17 gram/dose powder as needed.    psyllium husk, aspartame, (DAILY FIBER, PSYLLIUM-ASPART,) 3.4 gram packet Take 1 packet by mouth as needed.    syringe with needle (BD LUER-LOK SYRINGE) 3 mL 25 x 5/8 Use as directed with B12 6 each 1  trospium  (SANCTURA ) 20 mg tablet Take 1 tablet (20 mg total) by mouth nightly. 30 tablet 11 No facility-administered encounter medications on file as of 12/24/2023. [5] Social HistoryTobacco Use  Smoking status: Never  Smokeless tobacco: Never  Vaping Use  Vaping status: Never Used Substance Use Topics  Alcohol use: Not Currently   Alcohol/week: 2.0 standard drinks of alcohol   Types: 2 Glasses of wine per week  Drug use: No

## 2023-12-24 NOTE — Progress Notes
 Patient was identified as a fall risk. Universal Fall precautions protocol maintained during visit; Room was free of clutter Personal items were within reach Verification that all needs were met prior to leaving the examination room. Additional Universal Fall Interventions included: Patient informed to remain in chair until clinician is present.

## 2023-12-24 NOTE — Patient Instructions
 Drink 50 oz fluid per dayTaper fluids 3-4 hours before bed around 6 pm take night time pillsNo bladder irritants Pelvic floor PT Bowel regiment to avoid constipation

## 2023-12-30 ENCOUNTER — Inpatient Hospital Stay: Admit: 2023-12-30 | Discharge: 2023-12-30 | Payer: Medicare (Managed Care)

## 2023-12-30 DIAGNOSIS — N3941 Urge incontinence: Principal | ICD-10-CM

## 2023-12-30 NOTE — Other
 [facility] REHABILITATION [name] [place] [name] Number: [phone] Number: [phone] Therapy Pelvic EvaluationPatient Name:  Debra AllenMedical Record Number:  [id number] Date of Birth:  Feb 09, 1947Therapist:  Rumalda Jury, PTReferring Provider:  Margaretann Fridge, APRNICD-10 Diagnosis(es):Problem List       ICD-10-CM   PT - UI - 12/30/2023  Pelvic floor dysfunction M62.89  Nocturia R35.1  Urge incontinence N39.41 General InformationTherapy Episode of Care   Date of Visit:  12/30/2023    Treatment Number:  1    Date the Treatment Plan was Initiated/Reviewed:  12/30/2023   Start of Care Date:  12/30/2023    Progress Report Due Date:  01/29/2024    End Date of Authorization:  08/22/2024 New Neurological Deficit   Did the patient have a new neurological deficit as evidenced by diminished muscle strength   of less than 3 at any time during the 90 days after spine surgery:  N/AMedication Review:Current Outpatient Medications Medication Sig  acetaminophen Take 2 tablets (1,000 mg total) by mouth every 6 (six) hours as needed.  budesonide  Take 2 capsules (6 mg total) by mouth every morning.  calcium carbonate Take 2.5 tablets (1,500 mg total) by mouth daily.  chlorhexidine  gluconate Swish and spit 15 mLs 2 (two) times daily.  citalopram Take 0.5 tablets (10 mg total) by mouth daily.  cyanocobalamin  Inject 1 mL (1,000 mcg total) into the muscle every 30 (thirty) days.  Cequa  Apply to eye.  denosumab  (PROLIA  SUBQ) Inject under the skin. Twice yearly  docosahexaenoic acid/epa (FISH OIL  ORAL) Take by mouth daily. -per pt two capsules daily.  docusate sodium  Take 1 capsule (100 mg total) by mouth 2 (two) times daily.  famotidine  Take 1 tablet (20 mg total) by mouth 2 (two) times daily.  fluticasone  propionate Use 1 spray in each nostril daily as needed. gabapentin  Take 1 capsule (300 mg total) by mouth 4 (four) times daily.  glucosamine-chondroitin Take 1 tablet by mouth daily.  hydroxychloroquine TAKE 1 TABLET TWICE A DAY  iron Take 25 mg by mouth Daily @1700 .  levothyroxine 112 mcg one tab daily  midodrine  Take 1 tablet (2.5 mg total) by mouth 2 (two) times daily with breakfast and dinner. Do not take any doseslater than evening meal or less than 4 hours before bedtime.  multivitamin Take 1 capsule by mouth daily.  omega-3 fatty acids  Take 2 capsules (2 g total) by mouth daily.  pantoprazole  Take 1 tablet (40 mg total) by mouth daily.  penicillin  v potassium Take 1 tablet (500 mg total) by mouth every 12 (twelve) hours.  polyethylene glycol as needed.  Daily Fiber (psyllium-aspart) Take 1 packet by mouth as needed.  BD Luer-Lok Syringe Use as directed with B12  trospium  Take 1 tablet (20 mg total) by mouth nightly. SubjectiveBeverly [name] presented today with c/c/o pelvic dysfunction, nocturia, and urge incontinence. 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 Pt reporting urinary issues started around 1 year ago but has been progressively getting worse. Pt reporting she is having urinary leaking occasionally (1x/week) mostly if she has waited too long to use the bathroom while she is out of the house. Debra [name] reporting her biggest complaint at this time is nocturia.Chief Complaint:Nocturia Pertinent History of Current Problem:  IBS/colitisOther Providers: Margaretann Fridge, APRNPast Medical HistoryPast Medical History[1]Past Surgical HistoryPast Surgical History[2]AllergiesTramadol, Cyclobenzaprine, and Niacin preparationsPain Rating:  0 / 10 Reporting mild RLQ pains that mostly correlate with BMPrior Level of Function:  independent  Change in status from prior level of function?  independentObjectiveInitial Outcomes Measure completedPelvic HealthHistory   Abdominal Surgeries: Cholecystectomy   OB/GYN/Pelvic Surgeries:  Oophorectomy (R sided in 1961)   Cancer History & Staging:  Melanoma on ankle in 2003   Obstetric History      Gravida:  2      Para:  2      Vaginal births:  2      C-section births:  0      Birth weights:  7# 10 oz, 7# 13oz      Miscarriages:  0      Episiotomy:  Yes (a little stitches)   Endocrine history: Thyroid disorder  Thyroidectomy 1969Tests/Measures   Sexual Function Assessment      Sexual function:  Not activeBladder Function   Bladder Habits      Fluid intake, amounts and types:  4 oz water  in morning, cup of tea with breakfast, 8 oz water  with meds after breakfast, 8 oz water  with lunch, maybe grape juice at some point in the day, tea mid day,      Pad usage:  Yes (more for bowels)      Pad size:  Garment      Number of pads used per day:  1   Bladder Function      Bladder function:  Impaired      Void frequency (per day): ~10         Interval (min/hr): 1-2 hours       Nocturia:  Yes         Number of times:  4-6      Bladder urge:  Present      Bladder - Initiation: both.      Bladder - Stream: both.      Bladder - Sense of emptying: in the late afternoon she feels incomplete at times.      Post void dribble:  Absent      Urine flow test:  Stop      Sense of urgency:  Absent         Provoked by:  Full bladder      Straining:  Absent      UUI: Yes      Amount: Mild (damp)      Frequency: 1x/week      SUI: No      LP:[name] Function   Bowel function:  Impaired   Bowel urge:  Present      Frequency (times per day/wk):  Sometimes 2-3x/day, sometimes constipated for a few days   Bristol Stool Scale (stool consistency):  Type 6 - fluffy pieces with ragged edges, mushy stool (between type 1 and 6)   Urgency: Present   Sense of fully emptying:  Absent   Straining: Present      Constipation: Yes and Use of diet/supplements   Fecal incontinence: Yes         Frequency (times per day/wk): 1x/month   Pads used: YesOutcome Measures   Pelvic Floor Distress Inventory (PFDI 20)      Scoring: 0=Not present (never experienced)  1=Not at all (experienced previously)  2=Somewhat   3=Moderately   4=Quite a bit   Pelvic Organ Prolapse Distress Inventory 6 (POPDI-6)      1. Usually experience pressure in lower abdomen?  2       2. Usually experience heaviness/dullness in pelvic area? 1       3. Usually have bulge/something falling out you can see/feel in  vaginal area? 1       4. Ever have to push on vagina/around rectum to have complete bowel movement?  2      5. Usually experience feeling of incomplete bladder emptying?  1      6. Ever have to push on bulge in vaginal area with fingers to start/complete urination?  1      TOTAL Score:  33   Colorectal-Anal Distress inventory 8 (CRAD-8)      7. Feel need to strain too hard to have bowel movement?  3      8. Feel you have not completely emptied your bowels at end of bowel movement?  4      9. Usually lose stool beyond your control if your stool is well formed?  1      10. Usually lose stool beyond your control if stool is loose?  2      11. Usually lose gas from the rectum beyond your control?  3      12. Usually have pain when you pass stool?  4      13. Experience strong sense of urgency/rush to bathroom to have bowel movement?  2      14. Does part of bowel ever pass through rectum/bulge outside during/after bowel movement?  1      TOTAL Score:  63   Urinary Distress Inventory 6 (UDI-6)      15. Usually experience frequent urination?  4      16. Usually experience urine leakage associated with feeling of urgency?  3      17. Usually experience urine leakage related to coughing, sneezing, laughing?  2      18. Usually experience small amounts of urine leakage?  2      19. Usually experience difficulty emptying bladder?  1      20. Usually experience pain/discomfort in lower abdomen/genital region?  1      TOTAL Score:  54   SUMMARY SCORE:  150Overall Comments   9/4:POPDI: 33CRAD: 63UDI: 54                                      Treatment Provided This VisitCPT Code Interventions Timed Minutes Untimed Minutes Total Minutes Physical Therapy Evaluation - Moderate Complexity (02837) Physical therapy evaluation performed today for nocturia and urinary/fecal incontinence.  40  40 Therapeutic Activity (97530) Today's evaluation included the following information about pelvic floor physical therapy:1. Education of urge delay techniques including     -slowing down/taking time and not rushing     -breathing throughout motions until sitting on toilet to decreased incontinence risk     -calf raises/ankle pumps for suppression of S2 nerve at pelvic floor     -counting backwards from 100 by 7 (distracting the mind)     -Pelvic Floor contraction ([address] #3)2. Education on bladder irritants and being mindful of things that are impacting her/increasing urinary urgency3. Education on timed voiding throughout the day, trial of 2 hour timer on phone to increase bladder capacity in hopes this translates to decreased night voiding.Pt was educated about scope of pelvic floor physical therapy and understands assessment may include internal exam with a chaperone present. Pt reporting no specific consent at this time. PT to follow up and perform internal exam if pt consents in future 20  20 N/A     N/A  Total Treatment Time: 60 Problem ListProblem List       ICD-10-CM   PT - UI - 12/30/2023  Pelvic floor dysfunction M62.89  Nocturia R35.1  Urge incontinence N39.41 AssessmentBeverly [name] is an 8-78 y.o. female who presented today with c/c/o nocturia, urinary urge incontinence, and fecal urgency. Mardella demonstrating issues for ~1 year for urinary but reporting symptoms at night have been worsening. An internal assessment was described to Debra [name] today, no specific consent given at this time and hopeful for improvements with above listed changes. Overall Alanda demonstrates pelvic floor dysfunctions and would benefit from skilled therapy services at this time.Patient / Family / Caregiver EducationDiscussed role of therapyDiscussed the value of collaboration with other providersDiscussed the presenting problemReviewed the assessmentDiscussed plan of care and rationalePatient/Family/Caregiver demonstrate agreement with the planWritten materials / instruction providedRehab PotentialGoodPlanTherapeutic Exercise (97110)Manual Therapy (97140)Therapeutic Activity (97530)Self-Care/Home Management (97535)Gait Training (97116)Neuromuscular Re-Education (97112)Dry Needling 1 or 2 muscles  (20560)Dry Needling 3 or more muscles  (20561)Electrical Stimulation - attended (97032)Heat - Ice Pack Frequency:  1x per weekDuration:  6 weeksPlan for Next VisitReview urge delay techniques, review irritants, voiding scheduleRecommendationsNone at this timeGoalsShort Term Goals: (will be met in 5-6 visits)1. Patient will be Independent with the initial HEP to aid in increasing and maintaining gains made between PT sessions.2. PERF Testing (pt with right of refusal)3 Pt will report a decrease in night voiding to </= 3 time per night for improved rest. 4. Pt will report ability to delay voiding for at least 15 mins in order to get from bed to bathroom.5 Patient will increase water  intake to a total goal of 32-64  ounces per day for overall wellness and decrease bladder irritation. 6. Patient is able to demonstrate pelvic floor contraction with proper breathing pattern in order to successfully suppress urge to go .Long Term Goals: ( will be met in 10-12 visits )1. Pt will report less than 15 on the POPDI-6 for improved subjective ADLs.2. Pt will report less than 15 disability on the UDI-6 for improved subjective ADLs.3. Patient will report increased voiding intervals daytime to at least 2 hours or more evidenced by patient report and/or bladder diary4. Pt will report a decrease in night voiding to </= 1-2 time per night for improved rest.  [1] Past Medical History:Diagnosis Date  Anemia   Bilateral hand pain 11/11/2017  Disease of thyroid gland   Diverticulosis   GERD (gastroesophageal reflux disease)   Heart valve disease   Hyperlipidemia   IBS (irritable bowel syndrome)   Mitral regurgitation   Skin cancer   SLE (systemic lupus erythematosus) (HC Code)   [2] Past Surgical History:Procedure Laterality Date  CATARACT REMOVAL/IOL IMPLANT    CHOLECYSTECTOMY    COLONOSCOPY  11/25/2010  Diverticulosis of the sigmoid colon  COLONOSCOPY  02/26/2015  Diverticulosis of the sigmoid colon. Polyps (3 mm to 4 mm) in the mid-ascending colon. (Polypectomy). Polyp (4 mm to 6 mm) in the distal ascending colon. (Polypectomy).   EGD/Colonoscopy  07/20/2005  Nonerosive antral gastritis; diverticulosis coli. Bx shows chronic non specific inflammation  EGD/Colonoscopy  09/11/1999  Normal EGD; sigmoid diverticulosis.   ESOPHAGOGASTRODUODENOSCOPY  04/16/2009  Abnormally visible vascularity with absence of folds in the gastric body compatible with gastric mucosal atrophy. (Biopsy)  MOLE REMOVAL  2006  OOPHORECTOMY  1961  SKIN CANCER EXCISION    R arm (2017) and R ankle (2003)  THYROIDECTOMY  1969

## 2024-01-03 ENCOUNTER — Inpatient Hospital Stay: Admit: 2024-01-03 | Discharge: 2024-01-03 | Payer: PRIVATE HEALTH INSURANCE

## 2024-01-03 DIAGNOSIS — M6289 Other specified disorders of muscle: Principal | ICD-10-CM

## 2024-01-03 DIAGNOSIS — R351 Nocturia: Secondary | ICD-10-CM

## 2024-01-03 DIAGNOSIS — N3941 Urge incontinence: Secondary | ICD-10-CM

## 2024-01-03 NOTE — Other
 [facility] REHABILITATION [name] [place] [name] Number: [phone] Number: [phone] Therapy Pelvic Progress NotePatient Name:  Debra AllenMedical Record Number:  [id number] Date of Birth:  [birth year]Therapist:  Rumalda Jury, PTReferring Provider:  Margaretann Fridge, APRNICD-10 Diagnosis(es):Problem List       ICD-10-CM   PT - UI - 12/30/2023  Pelvic floor dysfunction M62.89  Nocturia R35.1  Urge incontinence N39.41 General InformationTherapy Episode of Care   Date of Visit:  01/03/2024    Treatment Number:  2    Date the Treatment Plan was Initiated/Reviewed:  12/30/2023   Start of Care Date:  12/30/2023    Progress Report Due Date:  01/29/2024    End Date of Authorization:  08/22/2024 New Neurological Deficit   Did the patient have a new neurological deficit as evidenced by diminished muscle strength   of less than 3 at any time during the 90 days after spine surgery:  N/AMedication Review:Current Outpatient Medications Medication Sig  acetaminophen Take 2 tablets (1,000 mg total) by mouth every 6 (six) hours as needed.  budesonide  Take 2 capsules (6 mg total) by mouth every morning.  calcium carbonate Take 2.5 tablets (1,500 mg total) by mouth daily.  chlorhexidine  gluconate Swish and spit 15 mLs 2 (two) times daily.  citalopram Take 0.5 tablets (10 mg total) by mouth daily.  cyanocobalamin  Inject 1 mL (1,000 mcg total) into the muscle every 30 (thirty) days.  Cequa  Apply to eye.  denosumab  (PROLIA  SUBQ) Inject under the skin. Twice yearly  docosahexaenoic acid/epa (FISH OIL  ORAL) Take by mouth daily. -per pt two capsules daily.  docusate sodium  Take 1 capsule (100 mg total) by mouth 2 (two) times daily.  famotidine  Take 1 tablet (20 mg total) by mouth 2 (two) times daily.  fluticasone  propionate Use 1 spray in each nostril daily as needed. gabapentin  Take 1 capsule (300 mg total) by mouth 4 (four) times daily.  glucosamine-chondroitin Take 1 tablet by mouth daily.  hydroxychloroquine TAKE 1 TABLET TWICE A DAY  iron Take 25 mg by mouth Daily @1700 .  levothyroxine 112 mcg one tab daily  midodrine  Take 1 tablet (2.5 mg total) by mouth 2 (two) times daily with breakfast and dinner. Do not take any doseslater than evening meal or less than 4 hours before bedtime.  multivitamin Take 1 capsule by mouth daily.  omega-3 fatty acids  Take 2 capsules (2 g total) by mouth daily.  pantoprazole  Take 1 tablet (40 mg total) by mouth daily.  penicillin  v potassium Take 1 tablet (500 mg total) by mouth every 12 (twelve) hours.  polyethylene glycol as needed.  Daily Fiber (psyllium-aspart) Take 1 packet by mouth as needed.  BD Luer-Lok Syringe Use as directed with B12  trospium  Take 1 tablet (20 mg total) by mouth nightly. Subjective9/8 - Debra [name] arriving today reporting no specific changes since last visit. Reporting she tried using urge delay techniques and was able to wait 15 more minutes after initial urge.[name] presented today with c/c/o pelvic dysfunction, nocturia, and urge incontinence. 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 Pt reporting urinary issues started around 1 year ago but has been progressively getting worse. Pt reporting she is having urinary leaking occasionally (1x/week) mostly if she has waited too long to use the bathroom while she is out of the house. Debra [name] reporting her biggest complaint at this time is nocturia.Chief Complaint:Nocturia Pertinent History of Current Problem:  IBS/colitisOther Providers: Margaretann Fridge, APRNPast  Medical HistoryPast Medical History[1]Past Surgical HistoryPast Surgical History[2]AllergiesTramadol, Cyclobenzaprine, and Niacin preparationsPain Rating:  0 / 10 Reporting mild RLQ pains that mostly correlate with BMPrior Level of Function:  independent   Change in status from prior level of function?  independentObjectiveOutcomes Measure not required for this visitPelvic HealthHistory   Abdominal Surgeries: Cholecystectomy   OB/GYN/Pelvic Surgeries:  Oophorectomy (R sided in 1961)   Cancer History & Staging:  Melanoma on ankle in 2003   Obstetric History      Gravida:  2      Para:  2      Vaginal births:  2      C-section births:  0      Birth weights:  7# 10 oz, 7# 13oz      Miscarriages:  0      Episiotomy:  Yes (a little stitches)   Endocrine history: Thyroid disorder  Thyroidectomy 1969Tests/Measures   Sexual Function Assessment      Sexual function:  Not activeBladder Function   Bladder Habits      Fluid intake, amounts and types:  4 oz water  in morning, cup of tea with breakfast, 8 oz water  with meds after breakfast, 8 oz water  with lunch, maybe grape juice at some point in the day, tea mid day,      Pad usage:  Yes (more for bowels)      Pad size:  Garment      Number of pads used per day:  1   Bladder Function      Bladder function:  Impaired      Void frequency (per day): ~10         Interval (min/hr): 1-2 hours       Nocturia:  Yes         Number of times:  4-6      Bladder urge:  Present      Bladder - Initiation: both.      Bladder - Stream: both.      Bladder - Sense of emptying: in the late afternoon she feels incomplete at times.      Post void dribble:  Absent      Urine flow test:  Stop      Sense of urgency:  Absent         Provoked by:  Full bladder      Straining:  Absent      UUI: Yes      Amount: Mild (damp)      Frequency: 1x/week      SUI: No      LP:Debra [name] Function   Bowel function:  Impaired   Bowel urge:  Present      Frequency (times per day/wk):  Sometimes 2-3x/day, sometimes constipated for a few days   Bristol Stool Scale (stool consistency):  Type 6 - fluffy pieces with ragged edges, mushy stool (between type 1 and 6)   Urgency: Present   Sense of fully emptying:  Absent   Straining: Present      Constipation: Yes and Use of diet/supplements   Fecal incontinence: Yes         Frequency (times per day/wk): 1x/month   Pads used: YesOutcome Measures   Pelvic Floor Distress Inventory (PFDI 20)      Scoring: 0=Not present (never experienced)  1=Not at all (experienced previously)  2=Somewhat   3=Moderately   4=Quite a bit   Pelvic Organ Prolapse Distress Inventory 6 (POPDI-6)      1. Usually experience  pressure in lower abdomen?  2       2. Usually experience heaviness/dullness in pelvic area? 1       3. Usually have bulge/something falling out you can see/feel in vaginal area? 1       4. Ever have to push on vagina/around rectum to have complete bowel movement?  2      5. Usually experience feeling of incomplete bladder emptying?  1      6. Ever have to push on bulge in vaginal area with fingers to start/complete urination?  1      TOTAL Score:  33   Colorectal-Anal Distress inventory 8 (CRAD-8)      7. Feel need to strain too hard to have bowel movement?  3      8. Feel you have not completely emptied your bowels at end of bowel movement?  4      9. Usually lose stool beyond your control if your stool is well formed?  1      10. Usually lose stool beyond your control if stool is loose?  2      11. Usually lose gas from the rectum beyond your control?  3      12. Usually have pain when you pass stool?  4      13. Experience strong sense of urgency/rush to bathroom to have bowel movement?  2      14. Does part of bowel ever pass through rectum/bulge outside during/after bowel movement?  1      TOTAL Score:  63   Urinary Distress Inventory 6 (UDI-6)      15. Usually experience frequent urination?  4      16. Usually experience urine leakage associated with feeling of urgency?  3 17. Usually experience urine leakage related to coughing, sneezing, laughing?  2      18. Usually experience small amounts of urine leakage?  2      19. Usually experience difficulty emptying bladder?  1      20. Usually experience pain/discomfort in lower abdomen/genital region?  1      TOTAL Score:  54   SUMMARY SCORE:  150Overall Comments   9/4:POPDI: 33CRAD: 63UDI: 54                                      Treatment Provided This VisitCPT Code Interventions Timed Minutes Untimed Minutes Total Minutes Therapeutic Activity [phone]) - Review of urge delay techniques, encouraged to use more distraction techniques (weeding garden, sending email) to increase time between urge and void) - encouraged that even increasing voiding time to 15 minutes is already demonstrating improvements- review of bladder irritants, pt initially reporting I don't think I can eliminate all of these. Education on purpose not being to eliminate foods but to have an understanding of what causes increased irritation for bladder. Reporting understanding- review of using timer to increase voiding times, pt reporting she was not able to do this since last visit. Encouraged to no use timer at night as to not wake herself up and reporting understanding.- encouraged to track foods/fluids from 3PM until bed as well as night time voids to give greater understanding of what/if anything is causing significant nocturia. 30  30 Therapeutic Activity (97530)     N/A     N/A       Total Treatment Time: 30 Problem ListProblem List  ICD-10-CM   PT - UI - 12/30/2023  Pelvic floor dysfunction M62.89  Nocturia R35.1  Urge incontinence N39.41 Assessment9/8 - Ellese arriving today demonstrating mild progress since IE last week. Time spent today reviewing information from last session. Pt encouraged to continue ise of urge delay techniques with greater focus on distraction and to be more mindful of urge with irritants.Debra [name] is an 8-80 y.o. female who presented today with c/c/o nocturia, urinary urge incontinence, and fecal urgency. Debra [name] demonstrating issues for ~1 year for urinary but reporting symptoms at night have been worsening. An internal assessment was described to Debra [name] today, no specific consent given at this time and hopeful for improvements with above listed changes. Overall Debra [name] demonstrates pelvic floor dysfunctions and would benefit from skilled therapy services at this time.Patient / Family / Caregiver EducationDiscussed role of therapyDiscussed the value of collaboration with other providersDiscussed the presenting problemReviewed the assessmentDiscussed plan of care and rationalePatient/Family/Caregiver demonstrate agreement with the planWritten materials / instruction providedRehab PotentialGoodPlanTherapeutic Exercise (97110)Manual Therapy (97140)Therapeutic Activity (97530)Self-Care/Home Management (97535)Gait Training (97116)Neuromuscular Re-Education (97112)Dry Needling 1 or 2 muscles  (20560)Dry Needling 3 or more muscles  (20561)Electrical Stimulation - attended (97032)Heat - Ice Pack Frequency:  1x per weekDuration:  6 weeksPlan for Next VisitReview urge delay techniques, review irritants, voiding scheduleRecommendationsNone at this timeGoalsShort Term Goals: (will be met in 5-6 visits)1. Patient will be Independent with the initial HEP to aid in increasing and maintaining gains made between PT sessions.2. PERF Testing (pt with right of refusal)3 Pt will report a decrease in night voiding to </= 3 time per night for improved rest. 4. Pt will report ability to delay voiding for at least 15 mins in order to get from bed to bathroom.5 Patient will increase water  intake to a total goal of 32-64  ounces per day for overall wellness and decrease bladder irritation. 6. Patient is able to demonstrate pelvic floor contraction with proper breathing pattern in order to successfully suppress urge to go .Long Term Goals: ( will be met in 10-12 visits )1. Pt will report less than 15 on the POPDI-6 for improved subjective ADLs.2. Pt will report less than 15 disability on the UDI-6 for improved subjective ADLs.3. Patient will report increased voiding intervals daytime to at least 2 hours or more evidenced by patient report and/or bladder diary4. Pt will report a decrease in night voiding to </= 1-2 time per night for improved rest.  [1] Past Medical History:Diagnosis Date  Anemia   Bilateral hand pain 11/11/2017  Disease of thyroid gland   Diverticulosis   GERD (gastroesophageal reflux disease)   Heart valve disease   Hyperlipidemia   IBS (irritable bowel syndrome)   Mitral regurgitation   Skin cancer   SLE (systemic lupus erythematosus) (HC Code)   [2] Past Surgical History:Procedure Laterality Date  CATARACT REMOVAL/IOL IMPLANT    CHOLECYSTECTOMY    COLONOSCOPY  11/25/2010  Diverticulosis of the sigmoid colon  COLONOSCOPY  02/26/2015  Diverticulosis of the sigmoid colon. Polyps (3 mm to 4 mm) in the mid-ascending colon. (Polypectomy). Polyp (4 mm to 6 mm) in the distal ascending colon. (Polypectomy).   EGD/Colonoscopy  07/20/2005  Nonerosive antral gastritis; diverticulosis coli. Bx shows chronic non specific inflammation  EGD/Colonoscopy  09/11/1999  Normal EGD; sigmoid diverticulosis.   ESOPHAGOGASTRODUODENOSCOPY  04/16/2009  Abnormally visible vascularity with absence of folds in the gastric body compatible with gastric mucosal atrophy. (Biopsy)  MOLE REMOVAL  2006  OOPHORECTOMY  1961  SKIN CANCER EXCISION  R arm (2017) and R ankle (2003)  THYROIDECTOMY  1969

## 2024-01-04 ENCOUNTER — Ambulatory Visit: Admit: 2024-01-04 | Payer: PRIVATE HEALTH INSURANCE

## 2024-01-05 ENCOUNTER — Ambulatory Visit: Admit: 2024-01-05 | Payer: PRIVATE HEALTH INSURANCE

## 2024-01-10 ENCOUNTER — Inpatient Hospital Stay: Admit: 2024-01-10 | Discharge: 2024-01-10 | Payer: PRIVATE HEALTH INSURANCE

## 2024-01-10 DIAGNOSIS — R351 Nocturia: Secondary | ICD-10-CM

## 2024-01-10 DIAGNOSIS — N3941 Urge incontinence: Secondary | ICD-10-CM

## 2024-01-10 DIAGNOSIS — M6289 Other specified disorders of muscle: Principal | ICD-10-CM

## 2024-01-10 NOTE — Other
 [facility] REHABILITATION [facility] [place] [name] Number: [phone] Number: [phone] Therapy Pelvic Progress NotePatient Name:  Debra AllenMedical Record Number:  [id number] Date of Birth:  09-17-1947Therapist:  Rumalda Jury, PTReferring Provider:  Margaretann Fridge, APRNICD-10 Diagnosis(es):Problem List       ICD-10-CM   PT - UI - 12/30/2023  Pelvic floor dysfunction M62.89  Nocturia R35.1  Urge incontinence N39.41 General InformationTherapy Episode of Care   Date of Visit:  01/03/2024    Treatment Number:  2    Date the Treatment Plan was Initiated/Reviewed:  12/30/2023   Start of Care Date:  12/30/2023    Progress Report Due Date:  01/29/2024    End Date of Authorization:  08/22/2024 New Neurological Deficit   Did the patient have a new neurological deficit as evidenced by diminished muscle strength   of less than 3 at any time during the 90 days after spine surgery:  N/AMedication Review:Current Outpatient Medications Medication Sig  acetaminophen Take 2 tablets (1,000 mg total) by mouth every 6 (six) hours as needed.  budesonide  Take 2 capsules (6 mg total) by mouth every morning.  calcium carbonate Take 2.5 tablets (1,500 mg total) by mouth daily.  chlorhexidine  gluconate Swish and spit 15 mLs 2 (two) times daily.  citalopram Take 0.5 tablets (10 mg total) by mouth daily.  cyanocobalamin  Inject 1 mL (1,000 mcg total) into the muscle every 30 (thirty) days.  Cequa  Apply to eye.  denosumab  (PROLIA  SUBQ) Inject under the skin. Twice yearly  docosahexaenoic acid/epa (FISH OIL  ORAL) Take by mouth daily. -per pt two capsules daily.  docusate sodium  Take 1 capsule (100 mg total) by mouth 2 (two) times daily.  famotidine  Take 1 tablet (20 mg total) by mouth 2 (two) times daily.  fluticasone  propionate Use 1 spray in each nostril daily as needed. gabapentin  Take 1 capsule (300 mg total) by mouth 4 (four) times daily.  glucosamine-chondroitin Take 1 tablet by mouth daily.  hydroxychloroquine TAKE 1 TABLET TWICE A DAY  iron Take 25 mg by mouth Daily @1700 .  levothyroxine 112 mcg one tab daily  midodrine  Take 1 tablet (2.5 mg total) by mouth 2 (two) times daily with breakfast and dinner. Do not take any doseslater than evening meal or less than 4 hours before bedtime.  multivitamin Take 1 capsule by mouth daily.  omega-3 fatty acids  Take 2 capsules (2 g total) by mouth daily.  pantoprazole  Take 1 tablet (40 mg total) by mouth daily.  penicillin  v potassium Take 1 tablet (500 mg total) by mouth every 12 (twelve) hours.  polyethylene glycol as needed.  Daily Fiber (psyllium-aspart) Take 1 packet by mouth as needed.  BD Luer-Lok Syringe Use as directed with B12  trospium  Take 1 tablet (20 mg total) by mouth nightly. Subjective9/15 [name] Debra arriving today reporting feeling mildly better. Bringing make shift diary showing some nights with only 3-4 bouts of nocturia. Reporting she did have a few leaks with urgency but inconsistent with urge delay techniques since last visit.[name] presented today with c/c/o pelvic dysfunction, nocturia, and urge incontinence. 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 Pt reporting urinary issues started around 1 year ago but has been progressively getting worse. Pt reporting she is having urinary leaking occasionally (1x/week) mostly if she has waited too long to use the bathroom while she is out of the house. Kynzie reporting her biggest complaint at this time is nocturia.Chief Complaint:Nocturia Pertinent  History of Current Problem:  IBS/colitisOther Providers: Margaretann Fridge, APRNPast Medical HistoryPast Medical History[1]Past Surgical HistoryPast Surgical History[2]AllergiesTramadol, Cyclobenzaprine, and Niacin preparationsPain Rating:  0 / 10 Reporting mild RLQ pains that mostly correlate with BMPrior Level of Function:  independent   Change in status from prior level of function?  independentObjectiveOutcomes Measure not required for this visitPelvic HealthHistory   Abdominal Surgeries: Cholecystectomy   OB/GYN/Pelvic Surgeries:  Oophorectomy (R sided in 1961)   Cancer History & Staging:  Melanoma on ankle in 2003   Obstetric History      Gravida:  2      Para:  2      Vaginal births:  2      C-section births:  0      Birth weights:  7# 10 oz, 7# 13oz      Miscarriages:  0      Episiotomy:  Yes (a little stitches)   Endocrine history: Thyroid disorder  Thyroidectomy 1969Tests/Measures   Sexual Function Assessment      Sexual function:  Not activeBladder Function   Bladder Habits      Fluid intake, amounts and types:  4 oz water  in morning, cup of tea with breakfast, 8 oz water  with meds after breakfast, 8 oz water  with lunch, maybe grape juice at some point in the day, tea mid day,      Pad usage:  Yes (more for bowels)      Pad size:  Garment      Number of pads used per day:  1   Bladder Function      Bladder function:  Impaired      Void frequency (per day): ~10         Interval (min/hr): 1-2 hours       Nocturia:  Yes         Number of times:  4-6      Bladder urge:  Present      Bladder - Initiation: both.      Bladder - Stream: both.      Bladder - Sense of emptying: in the late afternoon she feels incomplete at times.      Post void dribble:  Absent      Urine flow test:  Stop      Sense of urgency:  Absent         Provoked by:  Full bladder      Straining:  Absent      UUI: Yes      Amount: Mild (damp)      Frequency: 1x/week      SUI: No      LP:[name] Function   Bowel function:  Impaired   Bowel urge:  Present      Frequency (times per day/wk):  Sometimes 2-3x/day, sometimes constipated for a few days   Bristol Stool Scale (stool consistency):  Type 6 - fluffy pieces with ragged edges, mushy stool (between type 1 and 6)   Urgency: Present   Sense of fully emptying:  Absent   Straining: Present      Constipation: Yes and Use of diet/supplements   Fecal incontinence: Yes         Frequency (times per day/wk): 1x/month   Pads used: YesOutcome Measures   Pelvic Floor Distress Inventory (PFDI 20)      Scoring: 0=Not present (never experienced)  1=Not at all (experienced previously)  2=Somewhat   3=Moderately   4=Quite a bit   Pelvic Organ Prolapse Distress Inventory  6 (POPDI-6)      1. Usually experience pressure in lower abdomen?  2       2. Usually experience heaviness/dullness in pelvic area? 1       3. Usually have bulge/something falling out you can see/feel in vaginal area? 1       4. Ever have to push on vagina/around rectum to have complete bowel movement?  2      5. Usually experience feeling of incomplete bladder emptying?  1      6. Ever have to push on bulge in vaginal area with fingers to start/complete urination?  1      TOTAL Score:  33   Colorectal-Anal Distress inventory 8 (CRAD-8)      7. Feel need to strain too hard to have bowel movement?  3      8. Feel you have not completely emptied your bowels at end of bowel movement?  4      9. Usually lose stool beyond your control if your stool is well formed?  1      10. Usually lose stool beyond your control if stool is loose?  2      11. Usually lose gas from the rectum beyond your control?  3      12. Usually have pain when you pass stool?  4      13. Experience strong sense of urgency/rush to bathroom to have bowel movement?  2      14. Does part of bowel ever pass through rectum/bulge outside during/after bowel movement?  1      TOTAL Score:  63   Urinary Distress Inventory 6 (UDI-6)      15. Usually experience frequent urination?  4      16. Usually experience urine leakage associated with feeling of urgency?  3      17. Usually experience urine leakage related to coughing, sneezing, laughing?  2      18. Usually experience small amounts of urine leakage?  2      19. Usually experience difficulty emptying bladder?  1      20. Usually experience pain/discomfort in lower abdomen/genital region?  1      TOTAL Score:  54   SUMMARY SCORE:  150Overall Comments   9/4:POPDI: 33CRAD: 63UDI: 54                                      Treatment Provided This VisitCPT Code Interventions Timed Minutes Untimed Minutes Total Minutes Therapeutic Activity [phone]) - review of urge delay techniques, encouraged to implement more throughout the day d/t dropping off the past few days- encouraged to increase water  intake to hep with constipation. Reporting she wants to stop relying on metamucil and miralax as regularly- continue with stopping fluids 2-3 hours prior to bed (except for sipping water  with pills- trial avoiding bathroom for 15 minutes after initial urge 25  25 Therapeutic Activity (97530)     N/A     N/A       Total Treatment Time: 25 Problem ListProblem List       ICD-10-CM   PT - UI - 12/30/2023  Pelvic floor dysfunction M62.89  Nocturia R35.1  Urge incontinence N39.41 Assessment9/15 - Time spent today reviewing urge delay techniques and encouraged to continue with trial of holding for 15 minutes after initial urge.Shea Swalley is an 8-80 y.o. female who  presented today with c/c/o nocturia, urinary urge incontinence, and fecal urgency. Beatryce demonstrating issues for ~1 year for urinary but reporting symptoms at night have been worsening. An internal assessment was described to [facility] today, no specific consent given at this time and hopeful for improvements with above listed changes. Overall Verlaine demonstrates pelvic floor dysfunctions and would benefit from skilled therapy services at this time.Patient / Family / Caregiver EducationDiscussed role of therapyDiscussed the value of collaboration with other providersDiscussed the presenting problemReviewed the assessmentDiscussed plan of care and rationalePatient/Family/Caregiver demonstrate agreement with the planWritten materials / instruction providedRehab PotentialGoodPlanTherapeutic Exercise (97110)Manual Therapy (97140)Therapeutic Activity (97530)Self-Care/Home Management (97535)Gait Training (97116)Neuromuscular Re-Education (97112)Dry Needling 1 or 2 muscles  (20560)Dry Needling 3 or more muscles  (20561)Electrical Stimulation - attended (97032)Heat - Ice Pack Frequency:  1x per weekDuration:  6 weeksPlan for Next VisitReview urge delay techniques, review irritants, voiding scheduleRecommendationsNone at this timeGoalsShort Term Goals: (will be met in 5-6 visits)1. Patient will be Independent with the initial HEP to aid in increasing and maintaining gains made between PT sessions.2. PERF Testing (pt with right of refusal)3 Pt will report a decrease in night voiding to </= 3 time per night for improved rest. 4. Pt will report ability to delay voiding for at least 15 mins in order to get from bed to bathroom.5 Patient will increase water  intake to a total goal of 32-64  ounces per day for overall wellness and decrease bladder irritation. 6. Patient is able to demonstrate pelvic floor contraction with proper breathing pattern in order to successfully suppress urge to go .Long Term Goals: ( will be met in 10-12 visits )1. Pt will report less than 15 on the POPDI-6 for improved subjective ADLs.2. Pt will report less than 15 disability on the UDI-6 for improved subjective ADLs.3. Patient will report increased voiding intervals daytime to at least 2 hours or more evidenced by patient report and/or bladder diary4. Pt will report a decrease in night voiding to </= 1-2 time per night for improved rest.  [1] Past Medical History:Diagnosis Date  Anemia   Bilateral hand pain 11/11/2017  Disease of thyroid gland   Diverticulosis   GERD (gastroesophageal reflux disease)   Heart valve disease   Hyperlipidemia   IBS (irritable bowel syndrome)   Mitral regurgitation   Skin cancer   SLE (systemic lupus erythematosus) (HC Code)   [2] Past Surgical History:Procedure Laterality Date  CATARACT REMOVAL/IOL IMPLANT    CHOLECYSTECTOMY    COLONOSCOPY  11/25/2010  Diverticulosis of the sigmoid colon  COLONOSCOPY  02/26/2015  Diverticulosis of the sigmoid colon. Polyps (3 mm to 4 mm) in the mid-ascending colon. (Polypectomy). Polyp (4 mm to 6 mm) in the distal ascending colon. (Polypectomy).   EGD/Colonoscopy  07/20/2005  Nonerosive antral gastritis; diverticulosis coli. Bx shows chronic non specific inflammation  EGD/Colonoscopy  09/11/1999  Normal EGD; sigmoid diverticulosis.   ESOPHAGOGASTRODUODENOSCOPY  04/16/2009  Abnormally visible vascularity with absence of folds in the gastric body compatible with gastric mucosal atrophy. (Biopsy)  MOLE REMOVAL  2006  OOPHORECTOMY  1961  SKIN CANCER EXCISION    R arm (2017) and R ankle (2003)  THYROIDECTOMY  1969

## 2024-01-17 ENCOUNTER — Inpatient Hospital Stay: Admit: 2024-01-17 | Discharge: 2024-01-17 | Payer: PRIVATE HEALTH INSURANCE

## 2024-01-17 DIAGNOSIS — N3941 Urge incontinence: Secondary | ICD-10-CM

## 2024-01-17 DIAGNOSIS — R351 Nocturia: Secondary | ICD-10-CM

## 2024-01-17 DIAGNOSIS — M6289 Other specified disorders of muscle: Principal | ICD-10-CM

## 2024-01-17 NOTE — Other
 [facility] REHABILITATION [facility] [place] [name] Number: [id number] Number: [id number] Therapy Pelvic Progress NotePatient Name:  Debra AllenMedical Record Number:  [id number] Date of Birth:  [birth year]Therapist:  Rumalda Jury, PTReferring Provider:  Margaretann Fridge, APRNICD-10 Diagnosis(es):Problem List       ICD-10-CM   PT - UI - 12/30/2023  Pelvic floor dysfunction M62.89  Nocturia R35.1  Urge incontinence N39.41 General InformationTherapy Episode of Care   Date of Visit:  01/17/2024    Treatment Number:  3    Date the Treatment Plan was Initiated/Reviewed:  12/30/2023   Start of Care Date:  12/30/2023    Progress Report Due Date:  01/29/2024    End Date of Authorization:  08/22/2024 New Neurological Deficit   Did the patient have a new neurological deficit as evidenced by diminished muscle strength   of less than 3 at any time during the 90 days after spine surgery:  N/AMedication Review:Current Outpatient Medications Medication Sig  acetaminophen Take 2 tablets (1,000 mg total) by mouth every 6 (six) hours as needed.  budesonide  Take 2 capsules (6 mg total) by mouth every morning.  calcium carbonate Take 2.5 tablets (1,500 mg total) by mouth daily.  chlorhexidine  gluconate Swish and spit 15 mLs 2 (two) times daily.  citalopram Take 0.5 tablets (10 mg total) by mouth daily.  cyanocobalamin  Inject 1 mL (1,000 mcg total) into the muscle every 30 (thirty) days.  Cequa  Apply to eye.  denosumab  (PROLIA  SUBQ) Inject under the skin. Twice yearly  docosahexaenoic acid/epa (FISH OIL  ORAL) Take by mouth daily. -per pt two capsules daily.  docusate sodium  Take 1 capsule (100 mg total) by mouth 2 (two) times daily.  famotidine  Take 1 tablet (20 mg total) by mouth 2 (two) times daily.  fluticasone  propionate Use 1 spray in each nostril daily as needed. gabapentin  Take 1 capsule (300 mg total) by mouth 4 (four) times daily.  glucosamine-chondroitin Take 1 tablet by mouth daily.  hydroxychloroquine TAKE 1 TABLET TWICE A DAY  iron Take 25 mg by mouth Daily @1700 .  levothyroxine 112 mcg one tab daily  midodrine  Take 1 tablet (2.5 mg total) by mouth 2 (two) times daily with breakfast and dinner. Do not take any doseslater than evening meal or less than 4 hours before bedtime.  multivitamin Take 1 capsule by mouth daily.  omega-3 fatty acids  Take 2 capsules (2 g total) by mouth daily.  pantoprazole  Take 1 tablet (40 mg total) by mouth daily.  penicillin  v potassium Take 1 tablet (500 mg total) by mouth every 12 (twelve) hours.  polyethylene glycol as needed.  Daily Fiber (psyllium-aspart) Take 1 packet by mouth as needed.  BD Luer-Lok Syringe Use as directed with B12  trospium  Take 1 tablet (20 mg total) by mouth nightly. Subjective9/22- Reporting she continues to wake up 4 times per night for voiding. Eager to go on vacation.[name] presented today with c/c/o pelvic dysfunction, nocturia, and urge incontinence. 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 Pt reporting urinary issues started around 1 year ago but has been progressively getting worse. Pt reporting she is having urinary leaking occasionally (1x/week) mostly if she has waited too long to use the bathroom while she is out of the house. Debra [name] reporting her biggest complaint at this time is nocturia.Chief Complaint:Nocturia Pertinent History of Current Problem:  IBS/colitisOther Providers: Margaretann Fridge, APRNPast Medical HistoryPast Medical History[1]Past Surgical HistoryPast Surgical History[2]AllergiesTramadol, Cyclobenzaprine, and Niacin preparationsPain  Rating:  0 / 10 Reporting mild RLQ pains that mostly correlate with BMPrior Level of Function:  independent   Change in status from prior level of function?  independentObjectiveOutcomes Measure not required for this visitPelvic HealthHistory   Abdominal Surgeries: Cholecystectomy   OB/GYN/Pelvic Surgeries:  Oophorectomy (R sided in 1961)   Cancer History & Staging:  Melanoma on ankle in 2003   Obstetric History      Gravida:  2      Para:  2      Vaginal births:  2      C-section births:  0      Birth weights:  7# 10 oz, 7# 13oz      Miscarriages:  0      Episiotomy:  Yes (a little stitches)   Endocrine history: Thyroid disorder  Thyroidectomy 1969Tests/Measures   Sexual Function Assessment      Sexual function:  Not activeBladder Function   Bladder Habits      Fluid intake, amounts and types:  4 oz water  in morning, cup of tea with breakfast, 8 oz water  with meds after breakfast, 8 oz water  with lunch, maybe grape juice at some point in the day, tea mid day,      Pad usage:  Yes (more for bowels)      Pad size:  Garment      Number of pads used per day:  1   Bladder Function      Bladder function:  Impaired      Void frequency (per day): ~10         Interval (min/hr): 1-2 hours       Nocturia:  Yes         Number of times:  4-6      Bladder urge:  Present      Bladder - Initiation: both.      Bladder - Stream: both.      Bladder - Sense of emptying: in the late afternoon she feels incomplete at times.      Post void dribble:  Absent      Urine flow test:  Stop      Sense of urgency:  Absent         Provoked by:  Full bladder      Straining:  Absent      UUI: Yes      Amount: Mild (damp)      Frequency: 1x/week      SUI: No      LP:[name] Function   Bowel function:  Impaired   Bowel urge:  Present      Frequency (times per day/wk):  Sometimes 2-3x/day, sometimes constipated for a few days   Bristol Stool Scale (stool consistency):  Type 6 - fluffy pieces with ragged edges, mushy stool (between type 1 and 6)   Urgency: Present   Sense of fully emptying:  Absent   Straining: Present      Constipation: Yes and Use of diet/supplements   Fecal incontinence: Yes         Frequency (times per day/wk): 1x/month   Pads used: YesOutcome Measures   Pelvic Floor Distress Inventory (PFDI 20)      Scoring: 0=Not present (never experienced)  1=Not at all (experienced previously)  2=Somewhat   3=Moderately   4=Quite a bit   Pelvic Organ Prolapse Distress Inventory 6 (POPDI-6)      1. Usually experience pressure in lower abdomen?  2  2. Usually experience heaviness/dullness in pelvic area? 1       3. Usually have bulge/something falling out you can see/feel in vaginal area? 1       4. Ever have to push on vagina/around rectum to have complete bowel movement?  2      5. Usually experience feeling of incomplete bladder emptying?  1      6. Ever have to push on bulge in vaginal area with fingers to start/complete urination?  1      TOTAL Score:  33   Colorectal-Anal Distress inventory 8 (CRAD-8)      7. Feel need to strain too hard to have bowel movement?  3      8. Feel you have not completely emptied your bowels at end of bowel movement?  4      9. Usually lose stool beyond your control if your stool is well formed?  1      10. Usually lose stool beyond your control if stool is loose?  2      11. Usually lose gas from the rectum beyond your control?  3      12. Usually have pain when you pass stool?  4      13. Experience strong sense of urgency/rush to bathroom to have bowel movement?  2      14. Does part of bowel ever pass through rectum/bulge outside during/after bowel movement?  1      TOTAL Score:  63   Urinary Distress Inventory 6 (UDI-6)      15. Usually experience frequent urination?  4      16. Usually experience urine leakage associated with feeling of urgency?  3      17. Usually experience urine leakage related to coughing, sneezing, laughing? 2      18. Usually experience small amounts of urine leakage?  2      19. Usually experience difficulty emptying bladder?  1      20. Usually experience pain/discomfort in lower abdomen/genital region?  1      TOTAL Score:  54   SUMMARY SCORE:  150Overall Comments   9/4:POPDI: 33CRAD: 63UDI: 54                                      Treatment Provided This VisitCPT Code Interventions Timed Minutes Untimed Minutes Total Minutes Therapeutic Activity [phone]) - encouragement to continue with urge delay techniques d/t benefit seen and ability to hold for 15 more minutes after urge- questions answered about 2 hr timer, encouraged to use in afternoon d/t this being when urge increases- education on trying to hold longer at night, waiting 5-10 minutes after urge hits if she is able but not to let urge stop her from sleeping 25  25 Therapeutic Activity (97530)     N/A     N/A       Total Treatment Time: 25 Problem ListProblem List       ICD-10-CM   PT - UI - 12/30/2023  Pelvic floor dysfunction M62.89  Nocturia R35.1  Urge incontinence N39.41 Assessment9/22 - Pt tolerating session well today, reporting overall feeling like she is making progress during the day but continues to have difficulty with night voids. Encouraged to increase time from urge to getting up at night and reporting understanding however also encouraged not to let urge stop her from sleeping.Debra [name] is  an 8-80 y.o. female who presented today with c/c/o nocturia, urinary urge incontinence, and fecal urgency. Debra [name] demonstrating issues for ~1 year for urinary but reporting symptoms at night have been worsening. An internal assessment was described to [facility] today, no specific consent given at this time and hopeful for improvements with above listed changes. Overall Debra [name] demonstrates pelvic floor dysfunctions and would benefit from skilled therapy services at this time.Patient / Family / Caregiver EducationDiscussed role of therapyDiscussed the value of collaboration with other providersDiscussed the presenting problemReviewed the assessmentDiscussed plan of care and rationalePatient/Family/Caregiver demonstrate agreement with the planWritten materials / instruction providedRehab PotentialGoodPlanTherapeutic Exercise (97110)Manual Therapy (97140)Therapeutic Activity (97530)Self-Care/Home Management (97535)Gait Training (97116)Neuromuscular Re-Education (97112)Dry Needling 1 or 2 muscles  (20560)Dry Needling 3 or more muscles  (20561)Electrical Stimulation - attended (97032)Heat - Ice Pack Frequency:  1x per weekDuration:  6 weeksPlan for Next VisitReview urge delay techniques, review irritants, voiding scheduleRecommendationsNone at this timeGoalsShort Term Goals: (will be met in 5-6 visits)1. Patient will be Independent with the initial HEP to aid in increasing and maintaining gains made between PT sessions.2. PERF Testing (pt with right of refusal)3 Pt will report a decrease in night voiding to </= 3 time per night for improved rest. 4. Pt will report ability to delay voiding for at least 15 mins in order to get from bed to bathroom.5 Patient will increase water  intake to a total goal of 32-64  ounces per day for overall wellness and decrease bladder irritation. 6. Patient is able to demonstrate pelvic floor contraction with proper breathing pattern in order to successfully suppress urge to go .Long Term Goals: ( will be met in 10-12 visits )1. Pt will report less than 15 on the POPDI-6 for improved subjective ADLs.2. Pt will report less than 15 disability on the UDI-6 for improved subjective ADLs.3. Patient will report increased voiding intervals daytime to at least 2 hours or more evidenced by patient report and/or bladder diary4. Pt will report a decrease in night voiding to </= 1-2 time per night for improved rest.  [1] Past Medical History:Diagnosis Date  Anemia   Bilateral hand pain 11/11/2017  Disease of thyroid gland   Diverticulosis   GERD (gastroesophageal reflux disease)   Heart valve disease   Hyperlipidemia   IBS (irritable bowel syndrome)   Mitral regurgitation   Skin cancer   SLE (systemic lupus erythematosus) (HC Code)   [2] Past Surgical History:Procedure Laterality Date  CATARACT REMOVAL/IOL IMPLANT    CHOLECYSTECTOMY    COLONOSCOPY  11/25/2010  Diverticulosis of the sigmoid colon  COLONOSCOPY  02/26/2015  Diverticulosis of the sigmoid colon. Polyps (3 mm to 4 mm) in the mid-ascending colon. (Polypectomy). Polyp (4 mm to 6 mm) in the distal ascending colon. (Polypectomy).   EGD/Colonoscopy  07/20/2005  Nonerosive antral gastritis; diverticulosis coli. Bx shows chronic non specific inflammation  EGD/Colonoscopy  09/11/1999  Normal EGD; sigmoid diverticulosis.   ESOPHAGOGASTRODUODENOSCOPY  04/16/2009  Abnormally visible vascularity with absence of folds in the gastric body compatible with gastric mucosal atrophy. (Biopsy)  MOLE REMOVAL  2006  OOPHORECTOMY  1961  SKIN CANCER EXCISION    R arm (2017) and R ankle (2003)  THYROIDECTOMY  1969

## 2024-01-24 ENCOUNTER — Inpatient Hospital Stay: Admit: 2024-01-24 | Payer: PRIVATE HEALTH INSURANCE

## 2024-01-24 DIAGNOSIS — N3941 Urge incontinence: Secondary | ICD-10-CM

## 2024-01-25 DIAGNOSIS — M6289 Other specified disorders of muscle: Secondary | ICD-10-CM

## 2024-01-31 ENCOUNTER — Ambulatory Visit: Admit: 2024-01-31 | Payer: PRIVATE HEALTH INSURANCE

## 2024-02-08 ENCOUNTER — Inpatient Hospital Stay: Admit: 2024-02-08 | Discharge: 2024-02-08 | Payer: PRIVATE HEALTH INSURANCE

## 2024-02-08 DIAGNOSIS — N3941 Urge incontinence: Secondary | ICD-10-CM

## 2024-02-08 DIAGNOSIS — R351 Nocturia: Secondary | ICD-10-CM

## 2024-02-08 DIAGNOSIS — M6289 Other specified disorders of muscle: Principal | ICD-10-CM

## 2024-02-08 NOTE — Other
 [facility] REHABILITATION [facility] [place] [name] Number: [phone] Number: [phone] Therapy Pelvic Re-EvaluationPatient Name:  Debra AllenMedical Record Number:  [id number] Date of Birth:  1947-02-20Therapist:  Rumalda Jury, PTReferring Provider:  Margaretann Fridge, APRNICD-10 Diagnosis(es):Problem List       ICD-10-CM   PT - UI - 12/30/2023  Pelvic floor dysfunction M62.89  Nocturia R35.1  Urge incontinence N39.41 General InformationTherapy Episode of Care   Date of Visit:  02/08/2024    Treatment Number:  5    Date the Treatment Plan was Initiated/Reviewed:  12/30/2023   Start of Care Date:  12/30/2023    Progress Report Due Date:  01/29/2024    End Date of Authorization:  08/22/2024 New Neurological Deficit   Did the patient have a new neurological deficit as evidenced by diminished muscle strength   of less than 3 at any time during the 90 days after spine surgery:  N/AMedication Review:Current Outpatient Medications Medication Sig  acetaminophen Take 2 tablets (1,000 mg total) by mouth every 6 (six) hours as needed.  budesonide  Take 2 capsules (6 mg total) by mouth every morning.  calcium carbonate Take 2.5 tablets (1,500 mg total) by mouth daily.  chlorhexidine  gluconate Swish and spit 15 mLs 2 (two) times daily.  citalopram Take 0.5 tablets (10 mg total) by mouth daily.  cyanocobalamin  Inject 1 mL (1,000 mcg total) into the muscle every 30 (thirty) days.  Cequa  Apply to eye.  denosumab  (PROLIA  SUBQ) Inject under the skin. Twice yearly  docosahexaenoic acid/epa (FISH OIL  ORAL) Take by mouth daily. -per pt two capsules daily.  docusate sodium  Take 1 capsule (100 mg total) by mouth 2 (two) times daily.  famotidine  Take 1 tablet (20 mg total) by mouth 2 (two) times daily.  fluticasone  propionate Use 1 spray in each nostril daily as needed. gabapentin  Take 1 capsule (300 mg total) by mouth 4 (four) times daily.  glucosamine-chondroitin Take 1 tablet by mouth daily.  hydroxychloroquine TAKE 1 TABLET TWICE A DAY  iron Take 25 mg by mouth Daily @1700 .  levothyroxine 112 mcg one tab daily  midodrine  Take 1 tablet (2.5 mg total) by mouth 2 (two) times daily with breakfast and dinner. Do not take any doseslater than evening meal or less than 4 hours before bedtime.  multivitamin Take 1 capsule by mouth daily.  omega-3 fatty acids  Take 2 capsules (2 g total) by mouth daily.  pantoprazole  Take 1 tablet (40 mg total) by mouth daily.  penicillin  v potassium Take 1 tablet (500 mg total) by mouth every 12 (twelve) hours.  polyethylene glycol as needed.  Daily Fiber (psyllium-aspart) Take 1 packet by mouth as needed.  BD Luer-Lok Syringe Use as directed with B12  trospium  Take 1 tablet (20 mg total) by mouth nightly. Subjective10/14 [name] Debra [name] arriving today reporting overall not feeling different compared to when she started PT services. Pt reporting she continues to void 5 times per night with some urgency however reporting no urge incontinence throughout the day. Pt ran through foods and liquids she is eating and doesn't believe she is consuming anything that is causing a problem however educated on irritants (juice, tea, tomatoes) that she might be consuming regularly. Pt did report she arrived today reporting feeling like PF PT is not working for her but willing to continue to try for 1 more month with a few changes.Debra [name] presented today with c/c/o pelvic dysfunction, nocturia, and urge incontinence. 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 Pt reporting urinary issues started around 1 year ago but has been progressively getting worse. Pt reporting she is having urinary leaking occasionally (1x/week) mostly if she has waited too long to use the bathroom while she is out of the house. Debra [name] reporting her biggest complaint at this time is nocturia.Chief Complaint:Nocturia Pertinent History of Current Problem:  IBS/colitisOther Providers: Margaretann Fridge, APRNPast Medical HistoryPast Medical History[1]Past Surgical HistoryPast Surgical History[2]AllergiesTramadol, Cyclobenzaprine, and Niacin preparationsPain Rating:  0 / 10 Reporting mild RLQ pains that mostly correlate with BMPrior Level of Function:  independent   Change in status from prior level of function?  independentObjectiveInterim/Reassessment/Recert Outcomes Measure completedPelvic HealthHistory   Abdominal Surgeries: Cholecystectomy   OB/GYN/Pelvic Surgeries:  Oophorectomy (R sided in 1961)   Cancer History & Staging:  Melanoma on ankle in 2003   Obstetric History      Gravida:  2      Para:  2      Vaginal births:  2      C-section births:  0      Birth weights:  7# 10 oz, 7# 13oz      Miscarriages:  0      Episiotomy:  Yes (a little stitches)   Endocrine history: Thyroid disorder  Thyroidectomy 1969Tests/Measures   Sexual Function Assessment      Sexual function:  Not activeBladder Function   Bladder Habits      Fluid intake, amounts and types:  4 oz water  in morning, cup of tea with breakfast, 8 oz water  with meds after breakfast, 8 oz water  with lunch, maybe grape juice at some point in the day, tea mid day      Pad usage:  Yes (more for bowels)      Pad size:  Garment      Number of pads used per day:  1   Bladder Function      Bladder function:  Impaired      Void frequency (per day): ~10         Interval (min/hr): 1-2 hours       Nocturia:  Yes         Number of times:  4-6      Bladder urge:  Present      Initiation:  Present      Bladder - Stream: both.      Sense of fully emptying:  Absent (in the late afternoon)      Post void dribble:  Absent Urine flow test:  Stop      Sense of urgency:  Absent         Provoked by:  Full bladder      Straining:  Absent      UUI: No      Amount:      Frequency:      SUI: No      LP:Debra [name] Function   Bowel function:  Impaired   Bowel urge:  Present      Frequency (times per day/wk):  Sometimes 2-3x/day, sometimes constipated for a few days   Bristol Stool Scale (stool consistency):  Type 6 - fluffy pieces with ragged edges, mushy stool (between type 1 and 6)   Urgency: Present   Sense of fully emptying:  Absent   Straining: Present      Constipation: Yes and Use of diet/supplements   Fecal incontinence: Yes         Frequency (times per day/wk): 1x/month   Pads  used: YesOutcome Measures   Pelvic Floor Distress Inventory (PFDI 20)      Scoring: 0=Not present (never experienced)  1=Not at all (experienced previously)  2=Somewhat   3=Moderately   4=Quite a bit   Pelvic Organ Prolapse Distress Inventory 6 (POPDI-6)      1. Usually experience pressure in lower abdomen?  2       2. Usually experience heaviness/dullness in pelvic area? 0       3. Usually have bulge/something falling out you can see/feel in vaginal area? 0       4. Ever have to push on vagina/around rectum to have complete bowel movement?  0      5. Usually experience feeling of incomplete bladder emptying?  3      6. Ever have to push on bulge in vaginal area with fingers to start/complete urination?  0      TOTAL Score:  21   Colorectal-Anal Distress inventory 8 (CRAD-8)      7. Feel need to strain too hard to have bowel movement?  3      8. Feel you have not completely emptied your bowels at end of bowel movement?  2      9. Usually lose stool beyond your control if your stool is well formed?  0      10. Usually lose stool beyond your control if stool is loose?  2      11. Usually lose gas from the rectum beyond your control?  0      12. Usually have pain when you pass stool?  2      13. Experience strong sense of urgency/rush to bathroom to have bowel movement?  2      14. Does part of bowel ever pass through rectum/bulge outside during/after bowel movement?  0      TOTAL Score:  34   Urinary Distress Inventory 6 (UDI-6)      15. Usually experience frequent urination?  4      16. Usually experience urine leakage associated with feeling of urgency?  3      17. Usually experience urine leakage related to coughing, sneezing, laughing?  0      18. Usually experience small amounts of urine leakage?  2      19. Usually experience difficulty emptying bladder?  0      20. Usually experience pain/discomfort in lower abdomen/genital region?  0      TOTAL Score:  38   SUMMARY SCORE:  93Overall Comments   9/4:POPDI: 33CRAD: 63UDI: 5410/14:POPDI: 21CRAD: 34UDI: 38                                      Treatment Provided This VisitCPT Code Interventions Timed Minutes Untimed Minutes Total Minutes Therapeutic Activity (97530) - Reevaluation- review of urge delay techniques and use at night- encouraged to increase water  intake in the morning prior to tea/breakfast for decreased bladder irritation- encouraged to trial 1 hour timer for in between voids, not voiding prior to this but waiting until second urge until she does void- reminded of rocking forward specifically in afternoon when pt reports she notices incomplete voiding (urgency 10 minutes after voiding) 30  30 Therapeutic Activity (97530)     N/A     N/A       Total Treatment Time: 30 Problem ListProblem List  ICD-10-CM   PT - UI - 12/30/2023  Pelvic floor dysfunction M62.89  Nocturia R35.1  Urge incontinence N39.41 Assessment10/14: Debra [name] arrived today for 5th skilled PT session reporting not feeling significantly different compared to IE. Pt reporting overall willing to continue participating in PF PT at this time d/t review of information and adding new techniques into routine. Pt reporting she has noticed a decrease in urgency during the day with use of urge delay techniques and reporting overall feeling like she is not rushing at night anymore however still voiding 4-6 times per night. Pt reporting she is voiding on the first urge even if it is only 10 minutes after voiding. Encouraged to waiting until second urge and trial rocking (specifically in afternoon) after voiding. Debra [name] has made progress demonstrated through improvements on objective measures and would benefit from continued PT services at this time.IE: Debra [name] is an 78 y.o. female who presented today with c/c/o nocturia, urinary urge incontinence, and fecal urgency. Shahara demonstrating issues for ~1 year for urinary but reporting symptoms at night have been worsening. An internal assessment was described to [facility] today, no specific consent given at this time and hopeful for improvements with above listed changes. Overall Debra [name] demonstrates pelvic floor dysfunctions and would benefit from skilled therapy services at this time.Patient / Family / Caregiver EducationDiscussed role of therapyDiscussed the value of collaboration with other providersDiscussed the presenting problemReviewed the assessmentDiscussed plan of care and rationalePatient/Family/Caregiver demonstrate agreement with the planWritten materials / instruction providedRehab PotentialGoodPlanTherapeutic Exercise (97110)Manual Therapy (97140)Therapeutic Activity (97530)Self-Care/Home Management (97535)Gait Training (97116)Neuromuscular Re-Education (97112)Dry Needling 1 or 2 muscles  (20560)Dry Needling 3 or more muscles  (20561)Electrical Stimulation - attended (97032)Heat - Ice Pack Frequency:  every 2 weeksDuration:  4 weeksPlan for Next VisitReview urge delay techniques, review irritants, voiding scheduleRecommendationsNone at this timeGoalsShort Term Goals: (will be met in 5-6 visits)1. Patient will be Independent with the initial HEP to aid in increasing and maintaining gains made between PT sessions.2. PERF Testing (pt with right of refusal)3 Pt will report a decrease in night voiding to </= 3 time per night for improved rest. 4. Pt will report ability to delay voiding for at least 15 mins in order to get from bed to bathroom.5 Patient will increase water  intake to a total goal of 32-64  ounces per day for overall wellness and decrease bladder irritation. 6. Patient is able to demonstrate pelvic floor contraction with proper breathing pattern in order to successfully suppress urge to go .Long Term Goals: ( will be met in 10-12 visits )1. Pt will report less than 15 on the POPDI-6 for improved subjective ADLs.2. Pt will report less than 15 disability on the UDI-6 for improved subjective ADLs.3. Patient will report increased voiding intervals daytime to at least 2 hours or more evidenced by patient report and/or bladder diary4. Pt will report a decrease in night voiding to </= 1-2 time per night for improved rest.  [1] Past Medical History:Diagnosis Date  Anemia   Bilateral hand pain 11/11/2017  Disease of thyroid gland   Diverticulosis   GERD (gastroesophageal reflux disease)   Heart valve disease   Hyperlipidemia   IBS (irritable bowel syndrome)   Mitral regurgitation   Skin cancer   SLE (systemic lupus erythematosus) (HC Code)   [2] Past Surgical History:Procedure Laterality Date  CATARACT REMOVAL/IOL IMPLANT    CHOLECYSTECTOMY    COLONOSCOPY  11/25/2010  Diverticulosis of the sigmoid colon  COLONOSCOPY  02/26/2015  Diverticulosis of the sigmoid colon.  Polyps (3 mm to 4 mm) in the mid-ascending colon. (Polypectomy). Polyp (4 mm to 6 mm) in the distal ascending colon. (Polypectomy).   EGD/Colonoscopy  07/20/2005  Nonerosive antral gastritis; diverticulosis coli. Bx shows chronic non specific inflammation  EGD/Colonoscopy  09/11/1999  Normal EGD; sigmoid diverticulosis.   ESOPHAGOGASTRODUODENOSCOPY  04/16/2009  Abnormally visible vascularity with absence of folds in the gastric body compatible with gastric mucosal atrophy. (Biopsy)  MOLE REMOVAL  2006  OOPHORECTOMY  1961  SKIN CANCER EXCISION    R arm (2017) and R ankle (2003)  THYROIDECTOMY  1969

## 2024-02-22 ENCOUNTER — Inpatient Hospital Stay: Admit: 2024-02-22 | Discharge: 2024-02-22 | Payer: PRIVATE HEALTH INSURANCE

## 2024-02-22 DIAGNOSIS — N3941 Urge incontinence: Secondary | ICD-10-CM

## 2024-02-22 DIAGNOSIS — R351 Nocturia: Secondary | ICD-10-CM

## 2024-02-22 NOTE — Rehab Discharge Note [[id number]]
 [facility] REHABILITATION [facility] [place] [name] Number: [phone] Number: [phone] Therapy Pelvic Discharge NotePatient Name:  Debra AllenMedical Record Number:  [id number] Date of Birth:  [birth year]Therapist:  Rumalda Jury, PTReferring Provider:  Margaretann [name], APRNICD-10 Diagnosis(es):Problem List       ICD-10-CM   PT - UI - 12/30/2023  Pelvic floor dysfunction M62.89  Nocturia R35.1  Urge incontinence N39.41 General InformationTherapy Episode of Care   Date of Visit:  02/22/2024    Treatment Number:  6    Date the Treatment Plan was Initiated/Reviewed:  12/30/2023   Start of Care Date:  12/30/2023    Progress Report Due Date:  01/29/2024    End Date of Authorization:  08/22/2024 New Neurological Deficit   Did the patient have a new neurological deficit as evidenced by diminished muscle strength   of less than 3 at any time during the 90 days after spine surgery:  N/AMedication Review:Current Outpatient Medications Medication Sig  acetaminophen Take 2 tablets (1,000 mg total) by mouth every 6 (six) hours as needed.  budesonide  Take 2 capsules (6 mg total) by mouth every morning.  calcium carbonate Take 2.5 tablets (1,500 mg total) by mouth daily.  chlorhexidine  gluconate Swish and spit 15 mLs 2 (two) times daily.  citalopram Take 0.5 tablets (10 mg total) by mouth daily.  cyanocobalamin  Inject 1 mL (1,000 mcg total) into the muscle every 30 (thirty) days.  Cequa  Apply to eye.  denosumab  (PROLIA  SUBQ) Inject under the skin. Twice yearly  docosahexaenoic acid/epa (FISH OIL  ORAL) Take by mouth daily. -per pt two capsules daily.  docusate sodium  Take 1 capsule (100 mg total) by mouth 2 (two) times daily.  famotidine  Take 1 tablet (20 mg total) by mouth 2 (two) times daily.  fluticasone  propionate Use 1 spray in each nostril daily as needed.  gabapentin  Take 1 capsule (300 mg total) by mouth 4 (four) times daily.  glucosamine-chondroitin Take 1 tablet by mouth daily.  hydroxychloroquine TAKE 1 TABLET TWICE A DAY  iron Take 25 mg by mouth Daily @1700 .  levothyroxine 112 mcg one tab daily  midodrine  Take 1 tablet (2.5 mg total) by mouth 2 (two) times daily with breakfast and dinner. Do not take any doseslater than evening meal or less than 4 hours before bedtime.  multivitamin Take 1 capsule by mouth daily.  omega-3 fatty acids  Take 2 capsules (2 g total) by mouth daily.  pantoprazole  Take 1 tablet (40 mg total) by mouth daily.  penicillin  v potassium Take 1 tablet (500 mg total) by mouth every 12 (twelve) hours.  polyethylene glycol as needed.  Daily Fiber (psyllium-aspart) Take 1 packet by mouth as needed.  BD Luer-Lok Syringe Use as directed with B12  trospium  Take 1 tablet (20 mg total) by mouth nightly. Subjective10/28 [name] Debra [name] arrived today reporting still no significant changes. Review of information given and pt asking some questions during review (may be inconsistent with performance of urge delay techniques and rocking fwd while voiding). Pt reporting there are some nights where she is only voiding 3 times, initially reporting this is a few night and then reporting only happening once in past week. At this time, Debra [name] stating there are other things going on and feeling like PF PT will not make a difference moving forward, requesting to be d/c'd at this time.Debra [name] presented today with c/c/o pelvic dysfunction, nocturia, and urge incontinence. 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 Pt  reporting urinary issues started around 1 year ago but has been progressively getting worse. Pt reporting she is having urinary leaking occasionally (1x/week) mostly if she has waited too long to use the bathroom while she is out of the house. Debra [name] reporting her biggest complaint at this time is nocturia.Chief Complaint:Nocturia Pertinent History of Current Problem:  IBS/colitisOther Providers: Debra [name], APRNPast Medical HistoryPast Medical History[1]Past Surgical HistoryPast Surgical History[2]AllergiesTramadol, Cyclobenzaprine, and Niacin preparationsPain Rating:  0 / 10 Reporting mild RLQ pains that mostly correlate with BMPrior Level of Function:  independent   Change in status from prior level of function?  independentObjectiveInterim/Reassessment/Recert Outcomes Measure completedPelvic HealthHistory   Abdominal Surgeries: Cholecystectomy   OB/GYN/Pelvic Surgeries:  Oophorectomy (R sided in 1961)   Cancer History & Staging:  Melanoma on ankle in 2003   Obstetric History      Gravida:  2      Para:  2      Vaginal births:  2      C-section births:  0      Birth weights:  7# 10 oz, 7# 13oz      Miscarriages:  0      Episiotomy:  Yes (a little stitches)   Endocrine history: Thyroid disorder  Thyroidectomy 1969Tests/Measures   Sexual Function Assessment      Sexual function:  Not activeBladder Function   Bladder Habits      Fluid intake, amounts and types:  4 oz water  in morning, cup of tea with breakfast, 8 oz water  with meds after breakfast, 8 oz water  with lunch, maybe grape juice at some point in the day, tea mid day      Pad usage:  Yes (more for bowels)      Pad size:  Garment      Number of pads used per day:  1   Bladder Function      Bladder function:  Impaired      Void frequency (per day): ~10         Interval (min/hr): 1-2 hours       Nocturia:  Yes         Number of times:  3-5      Bladder urge:  Present      Initiation:  Present      Bladder - Stream: both.      Sense of fully emptying:  Absent (in the late afternoon)      Post void dribble:  Absent      Urine flow test:  Stop      Sense of urgency:  Absent         Provoked by:  Full bladder      Straining:  Absent      UUI: No      Amount:      Frequency:      SUI: No      LP:[name] Function   Bowel function:  Impaired   Bowel urge:  Present      Frequency (times per day/wk):  Sometimes 2-3x/day, sometimes constipated for a few days   Bristol Stool Scale (stool consistency):  Type 6 - fluffy pieces with ragged edges, mushy stool (between type 1 and 6)   Urgency: Present   Sense of fully emptying:  Absent   Straining: Present      Constipation: Yes and Use of diet/supplements   Fecal incontinence: Yes         Frequency (times per day/wk): 1x/month   Pads used: YesOutcome  Measures   Pelvic Floor Distress Inventory (PFDI 20)      Scoring: 0=Not present (never experienced)  1=Not at all (experienced previously)  2=Somewhat   3=Moderately   4=Quite a bit   Pelvic Organ Prolapse Distress Inventory 6 (POPDI-6)      1. Usually experience pressure in lower abdomen?  2       2. Usually experience heaviness/dullness in pelvic area? 0       3. Usually have bulge/something falling out you can see/feel in vaginal area? 0       4. Ever have to push on vagina/around rectum to have complete bowel movement?  0      5. Usually experience feeling of incomplete bladder emptying?  3      6. Ever have to push on bulge in vaginal area with fingers to start/complete urination?  0      TOTAL Score:  21   Colorectal-Anal Distress inventory 8 (CRAD-8)      7. Feel need to strain too hard to have bowel movement?  3      8. Feel you have not completely emptied your bowels at end of bowel movement?  2      9. Usually lose stool beyond your control if your stool is well formed?  0      10. Usually lose stool beyond your control if stool is loose?  2      11. Usually lose gas from the rectum beyond your control?  0      12. Usually have pain when you pass stool?  2      13. Experience strong sense of urgency/rush to bathroom to have bowel movement?  2      14. Does part of bowel ever pass through rectum/bulge outside during/after bowel movement?  0      TOTAL Score:  34   Urinary Distress Inventory 6 (UDI-6)      15. Usually experience frequent urination?  4      16. Usually experience urine leakage associated with feeling of urgency?  3      17. Usually experience urine leakage related to coughing, sneezing, laughing?  0      18. Usually experience small amounts of urine leakage?  2      19. Usually experience difficulty emptying bladder?  0      20. Usually experience pain/discomfort in lower abdomen/genital region?  0      TOTAL Score:  38   SUMMARY SCORE:  93Overall Comments   9/4:POPDI: 33CRAD: 63UDI: 5410/14:POPDI: 21CRAD: 34UDI: 38                                      Treatment Provided This VisitCPT Code Interventions Timed Minutes Untimed Minutes Total Minutes Therapeutic Activity (97530) - Review of urge delay techniques- Questions answered about rocking forward and other techniques- Encouraged to continue with information given and continue working towards progress independently. 20  20 Therapeutic Activity (97530)     N/A     N/A       Total Treatment Time: 20 Problem ListProblem List       ICD-10-CM   PT - UI - 12/30/2023  Pelvic floor dysfunction M62.89  Nocturia R35.1  Urge incontinence N39.41 Assessment10/28 - Pt arriving today reporting still no significant changes in symptoms. She has decreased urinary urgency during the day and reporting no longer  has issues with this. Night time continues to be her biggest problem, voiding 3-5 times per night. Pt encouraged to continue with information given. At this time, Ridley will be d/c'd from PT services per pt request.IE: Jannifer Fischler is an 78 y.o. female who presented today with c/c/o nocturia, urinary urge incontinence, and fecal urgency. Prue demonstrating issues for ~1 year for urinary but reporting symptoms at night have been worsening. An internal assessment was described to [facility] today, no specific consent given at this time and hopeful for improvements with above listed changes. Overall Puneet demonstrates pelvic floor dysfunctions and would benefit from skilled therapy services at this time. [1] Past Medical History:Diagnosis Date  Anemia   Bilateral hand pain 11/11/2017  Disease of thyroid gland   Diverticulosis   GERD (gastroesophageal reflux disease)   Heart valve disease   Hyperlipidemia   IBS (irritable bowel syndrome)   Mitral regurgitation   Skin cancer   SLE (systemic lupus erythematosus) (HC Code)   [2] Past Surgical History:Procedure Laterality Date  CATARACT REMOVAL/IOL IMPLANT    CHOLECYSTECTOMY    COLONOSCOPY  11/25/2010  Diverticulosis of the sigmoid colon  COLONOSCOPY  02/26/2015  Diverticulosis of the sigmoid colon. Polyps (3 mm to 4 mm) in the mid-ascending colon. (Polypectomy). Polyp (4 mm to 6 mm) in the distal ascending colon. (Polypectomy).   EGD/Colonoscopy  07/20/2005  Nonerosive antral gastritis; diverticulosis coli. Bx shows chronic non specific inflammation  EGD/Colonoscopy  09/11/1999  Normal EGD; sigmoid diverticulosis.   ESOPHAGOGASTRODUODENOSCOPY  04/16/2009  Abnormally visible vascularity with absence of folds in the gastric body compatible with gastric mucosal atrophy. (Biopsy)  MOLE REMOVAL  2006  OOPHORECTOMY  1961  SKIN CANCER EXCISION    R arm (2017) and R ankle (2003)  THYROIDECTOMY  1969

## 2024-02-25 DIAGNOSIS — M6289 Other specified disorders of muscle: Secondary | ICD-10-CM

## 2024-02-28 ENCOUNTER — Encounter: Admit: 2024-02-28 | Payer: PRIVATE HEALTH INSURANCE | Attending: Neurology

## 2024-03-06 ENCOUNTER — Ambulatory Visit: Admit: 2024-03-06 | Payer: PRIVATE HEALTH INSURANCE

## 2024-03-14 ENCOUNTER — Encounter: Admit: 2024-03-14 | Payer: PRIVATE HEALTH INSURANCE | Attending: Neurology

## 2024-03-14 ENCOUNTER — Ambulatory Visit: Admit: 2024-03-14 | Payer: PRIVATE HEALTH INSURANCE | Attending: Neurology

## 2024-03-14 VITALS — BP 108/60 | HR 70 | Wt 107.0 lb

## 2024-03-14 DIAGNOSIS — M79641 Pain in right hand: Secondary | ICD-10-CM

## 2024-03-14 DIAGNOSIS — I951 Orthostatic hypotension: Secondary | ICD-10-CM

## 2024-03-14 DIAGNOSIS — I38 Endocarditis, valve unspecified: Secondary | ICD-10-CM

## 2024-03-14 DIAGNOSIS — K219 Gastro-esophageal reflux disease without esophagitis: Secondary | ICD-10-CM

## 2024-03-14 DIAGNOSIS — R2681 Unsteadiness on feet: Principal | ICD-10-CM

## 2024-03-14 DIAGNOSIS — E785 Hyperlipidemia, unspecified: Secondary | ICD-10-CM

## 2024-03-14 DIAGNOSIS — M542 Cervicalgia: Secondary | ICD-10-CM

## 2024-03-14 DIAGNOSIS — C4490 Unspecified malignant neoplasm of skin, unspecified: Secondary | ICD-10-CM

## 2024-03-14 DIAGNOSIS — I340 Nonrheumatic mitral (valve) insufficiency: Secondary | ICD-10-CM

## 2024-03-14 DIAGNOSIS — K5790 Diverticulosis of intestine, part unspecified, without perforation or abscess without bleeding: Principal | ICD-10-CM

## 2024-03-14 DIAGNOSIS — E079 Disorder of thyroid, unspecified: Secondary | ICD-10-CM

## 2024-03-14 DIAGNOSIS — M329 Systemic lupus erythematosus, unspecified: Secondary | ICD-10-CM

## 2024-03-14 DIAGNOSIS — D649 Anemia, unspecified: Secondary | ICD-10-CM

## 2024-03-14 DIAGNOSIS — K589 Irritable bowel syndrome without diarrhea: Secondary | ICD-10-CM

## 2024-03-14 MED ORDER — DOXYCYCLINE MONOHYDRATE 50 MG CAPSULE
50 | ORAL | 1.00 refills | 7.00000 days | Status: AC
Start: 2024-03-14 — End: ?

## 2024-03-14 MED ORDER — MIEBO (PF) 100 % EYE DROPS
100 | OPHTHALMIC | 12.00 refills | 21.50000 days | Status: AC
Start: 2024-03-14 — End: ?

## 2024-03-14 MED ORDER — LINZESS 290 MCG CAPSULE
290 | ORAL | 4.00 refills | 90.00000 days | Status: AC
Start: 2024-03-14 — End: ?

## 2024-03-14 MED ORDER — AZELAIC ACID 15 % TOPICAL GEL
15 | 0.00 refills | Status: AC
Start: 2024-03-14 — End: ?

## 2024-03-15 ENCOUNTER — Encounter: Admit: 2024-03-15 | Payer: PRIVATE HEALTH INSURANCE

## 2024-03-15 DIAGNOSIS — I340 Nonrheumatic mitral (valve) insufficiency: Principal | ICD-10-CM

## 2024-03-17 ENCOUNTER — Inpatient Hospital Stay: Admit: 2024-03-17 | Discharge: 2024-03-17 | Payer: Medicare (Managed Care)

## 2024-03-17 DIAGNOSIS — R2681 Unsteadiness on feet: Principal | ICD-10-CM

## 2024-03-17 LAB — FOLATE: BKR FOLATE: 18.6 ng/mL (ref >5.4)

## 2024-03-18 ENCOUNTER — Encounter: Admit: 2024-03-18 | Payer: PRIVATE HEALTH INSURANCE | Attending: Endocrinology, Diabetes & Metabolism

## 2024-03-20 LAB — METHYLMALONIC ACID: BKR METHYLMALONIC ACID: 0.25 umol/L (ref 0.00–0.40)

## 2024-03-21 ENCOUNTER — Encounter: Admit: 2024-03-21 | Payer: PRIVATE HEALTH INSURANCE | Attending: Neurology

## 2024-03-21 MED ORDER — LEVOTHYROXINE 112 MCG TABLET
112 | ORAL_TABLET | Freq: Every day | ORAL | 1 refills | 90.00000 days | Status: AC
Start: 2024-03-21 — End: ?

## 2024-03-21 NOTE — Result Encounter Note [77]
 Called pt, LMOM about normal labs.BO

## 2024-03-27 ENCOUNTER — Inpatient Hospital Stay: Admit: 2024-03-27 | Discharge: 2024-03-27 | Payer: PRIVATE HEALTH INSURANCE | Attending: Neurology

## 2024-03-27 DIAGNOSIS — R531 Weakness: Secondary | ICD-10-CM

## 2024-03-27 DIAGNOSIS — R208 Other disturbances of skin sensation: Secondary | ICD-10-CM

## 2024-03-27 DIAGNOSIS — R2681 Unsteadiness on feet: Secondary | ICD-10-CM

## 2024-03-27 DIAGNOSIS — M5416 Radiculopathy, lumbar region: Secondary | ICD-10-CM

## 2024-03-27 NOTE — Procedures [3]
 The full report is located in the procedures tab in Epic in the Result Images/Documents section or in the Result History link. Please click the View Image link to see the report PDF file.

## 2024-03-28 ENCOUNTER — Inpatient Hospital Stay: Admit: 2024-03-28 | Discharge: 2024-03-28 | Payer: Medicare (Managed Care)

## 2024-03-28 DIAGNOSIS — R2681 Unsteadiness on feet: Principal | ICD-10-CM

## 2024-03-28 NOTE — Result Encounter Note [77]
 Please call patient EMG does not show evidence of large nerve polyneuropathy, it shows evidence of chronic back problems at L4-L5.  Start/continue physical therapy.

## 2024-03-28 NOTE — Progress Notes [1]
 Program_ID:[id number] Access Code: [email or web address]: [email or web address]: 12-02-2025Prepared By: Medford Mass NotesExercises    - Church [place]ew - 1 x daily - 7 x weekly - 3 sets - 10 reps    - Tandem Stance - 1 x daily - 7 x weekly - 3 sets - 10 reps

## 2024-03-29 NOTE — Rehab Eval/Progress [[id number]]
 [facility] REHABILITATION [facility] [place] [name] Number: [phone] Number: [phone] Therapy Orthopedic EvaluationPatient Name:  Debra AllenMedical Record Number:  [id number] Date of Birth:  [birth year]Therapist:  Medford Calandra, PTReferring Provider:  Margaretann Fridge, APRNICD-10 Diagnosis(es):Problem List       ICD-10-CM   PT - UI - 12/30/2023  Pelvic floor dysfunction M62.89  Nocturia R35.1  Urge incontinence N39.41   PT-Gait Instability-03/28/24  Gait instability R26.81 General InformationGeneral InformationMedication Review:Current Outpatient Medications Medication Sig  acetaminophen Take 2 tablets (1,000 mg total) by mouth every 6 (six) hours as needed.  azelaic acid  APPLY TOPICALLY TO AFFECTED AREA TWICE DAILY  budesonide  Take 2 capsules (6 mg total) by mouth every morning. (Patient not taking: Reported on 03/14/2024)  calcium  carbonate Take 2.5 tablets (1,500 mg total) by mouth daily.  chlorhexidine  gluconate Swish and spit 15 mLs 2 (two) times daily.  citalopram Take 0.5 tablets (10 mg total) by mouth daily.  cyanocobalamin  Inject 1 mL (1,000 mcg total) into the muscle every 30 (thirty) days.  Cequa  Apply to eye. (Patient not taking: Reported on 03/14/2024)  denosumab  (PROLIA  SUBQ) Inject under the skin. Twice yearly (Patient not taking: Reported on 03/14/2024)  docosahexaenoic acid/epa (FISH OIL  ORAL) Take by mouth daily. -per pt two capsules daily.  docusate sodium  Take 1 capsule (100 mg total) by mouth 2 (two) times daily.  doxycycline  Take 1 capsule (50 mg total) by mouth daily.  famotidine  Take 1 tablet (20 mg total) by mouth 2 (two) times daily.  fluticasone  propionate Use 1 spray in each nostril daily as needed.  gabapentin  Take 1 capsule (300 mg total) by mouth 4 (four) times daily.  glucosamine-chondroitin Take 1 tablet by mouth daily.  hydroxychloroquine TAKE 1 TABLET TWICE A DAY  iron Take 25 mg by mouth Daily @1700 .  levothyroxine  Take 1 tablet (112 mcg total) by mouth daily.  Linzess  TAKE 1 CAPSULE BY MOUTH ONCE DAILY ON AN EMPTY STOMACH AT LEAST 30 MINUTES BEFORE A MEAL AT THE SAME TIME EACH DAY  midodrine  Take 1 tablet (2.5 mg total) by mouth 2 (two) times daily with breakfast and dinner. Do not take any doseslater than evening meal or less than 4 hours before bedtime.  Miebo  (PF)   multivitamin Take 1 capsule by mouth daily.  omega-3 fatty acids  Take 2 capsules (2 g total) by mouth daily.  pantoprazole  Take 1 tablet (40 mg total) by mouth daily.  penicillin  v potassium Take 1 tablet (500 mg total) by mouth every 12 (twelve) hours.  polyethylene glycol as needed.  Daily Fiber (psyllium-aspart) Take 1 packet by mouth as needed.  BD Luer-Lok Syringe Use as directed with B12  trospium  Take 1 tablet (20 mg total) by mouth nightly. 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 She was referred evaluation/treatment of her balance issues.  Reports one fall in April 2025, hit head, fell backward. Balance instability and gait disturbance- Persistent unsteadiness, especially pronounced upon rising in the morning- Veering to either side while ambulating.- Symptoms present for several years- Veering to the left when looking to the right during ambulation- Occasional lightheadedness- Orthostatic hypotension with low morning blood pressure; managed with midodrine  twice daily- Utilizes a nightlight in the bathroom to reduce fall risk- Recent nerve conduction study performed; results pendingChronic neck and low back pain- Baseline pain severity 4/10-  Ongoing management by Dr. Flynn- Received neck injection in September 2025- History of prior epidural injections and nerve blocksFall history- [place] in April 2025 while ascending back stairs, striking head on cement sidewalk- Did not seek immediate medical attention- Subsequently underwent back x-rayFunctional mobility and activities- Lives with minimal assistance- Manages six stairs using left handrail- Occasionally uses a cane for longer distances, such as in airports- Performs morning bed exercises including neck range of motion, knee to chest, lower trunk rotation, straight leg raises, and figure four stretches, which improve flexibilityResultsRADIOLOGYLacunar infarction: Right superior cerebellar hemisphereOther Providers: Avelina Arlean Denison, NP(PCP)Past Medical HistoryPast Medical History[1]Past Surgical HistoryPast Surgical History[2]AllergiesTramadol, Cyclobenzaprine, and Niacin preparationsPain Rating:  4 / 10   Comments:  baseline LBP and neck pain, seeing Dr. Flynn, injections most recently in neck in Sept of 25. Social / Emotional Information:  Lives alone, Adult children living in [place] and [place]. 6 STE, left sided HR going up. Prior Level of Function:  ongoing difficulty walking, dizziness(hx of orthorstatic hypotension.   Change in Status from prior level of function?  SomewhatObjectivePosture: WFL'sMMT/ROM:Lumbar AROM WFLsC spine AROM WFL's, mild stiffness with left rotation(R 40?, L 30?)UE AROM WFL'SLE strength 4/5 throughout except hamstrings 3+/5UE strength 3+/5 throughout.Palpation:No pertinent findingsSensation:Light Touch:  IntactDeep Pressure:  IntactProprioception:  IntactMyotomes:  IntactDermatomes:  IntactBalance:DL Static eyes open:  GoodTandem eyes open:  R back 10 sec, L back 5 sec.  Eyes closed 1 sec R back, 4sec L back.Single leg eyes open:  5 sec R, 7 sec LAuditory:WFL'sVisual:WFL's, cataract surgerySpecial TestsMildly positive dysdiadochokinesia L foot taps, left handSlight nystagmus when tracking pen to left and moving head to R.Negative RhombergPositive Sharpened RhombergFunctional MobilityBed Mobility:  IndTransfers:   IndAmbulation:   Ind     Assistive Device:  Occasional cane use with longer walks.Gait AssessmentNear tandem bos during ambulation, mild path deviation, short stride. Minimal foot clearance.Orthopedic Tools/Scales/Outcome MeasuresInitial Outcomes Measure completedPatient Specific Functional Scale (PSFS)   Activity #1 Score:  4      Details:   Walking in straight line tend to veer to left when looking R.   Activity #2 Score:  3      Details:   Feeling like I'm going to fall looking to right side   Activity #3 Score:  4      Details:   Going down stairs.   TOTAL SCORE:  4   (PSFS Scoring:  0=Extreme difficulty completing task     10=Performs task easily)   Comments:  4(03/29/24)Dynamic Gait Index      Gait - level surface:  2      Change in gait speed:  2      Gait with horizontal head turns:  1      Gait with vertical head turns:  3      Gait and pivot turn:  2      Step over obstacle:  2      Step around obstacle:  2      Stairs:  2   Total Score:  16       Scores of 19 or less have been related to increased incidence of falls in the elderly   Comments:  16(03/28/24), Note gait with horizontal head turns is borderline severe impairment.                                      Treatment Provided This VisitCPT Code Interventions Timed  Minutes Untimed Minutes Total Minutes Physical Therapy Evaluation - Moderate Complexity (97162) 30 sec sit to stand:8 Reps, no UE assist((age related norm is 9-14reps)TUG:  13.41s, 11.73sDGI: 16   30 Therapeutic Exercise (97110) VOR activities for accomodation seatedChurch pewTandem stance. 15 N/A     N/A       Total Treatment Time: 45 Problem ListDifficulty turning head and walking(path deviation)Slow pace of gait, short stride, narrow BOS, minimal foot clearanceMild LE weakness noted by 30 sec sit to stand testFall Risk per DGI.AssessmentGait instability and balance impairmentChronic gait instability and balance impairment, likely multifactorial with possible contribution from lacunar infarct in the right superior cerebellum. Differential includes inner ear issues. Compensatory gait pattern.- Scheduled physical therapy twice a week for one month to improve strength and balance.- Instructed on home exercises, narrow BOS and A<>P WS for low level balance activities.- Encouraged use of a cane for longer walks to prevent falls.Orthostatic hypotensionManaged with midodrine . Blood pressure low in the morning but improves with medication. Patient had a significant fall in April of this year, resulting in a head injury.- Continue midodrine  twice daily to manage orthostatic hypotension.- Monitor blood pressure regularly, especially in the morning.PT warranted to provide with appropriate HEP for balance and strengthening so as to minimize fall risk and attempt to normalize gait pattern.Patient / Family / Caregiver EducationDiscussed role of therapyDiscussed the value of collaboration with other providersDiscussed the presenting problemReviewed the assessmentDiscussed plan of care and rationalePatient/Family/Caregiver demonstrate agreement with the planWritten materials / instruction providedRehab PotentialFairPlanTherapeutic Exercise (97110)Manual Therapy (97140)Therapeutic Activity (97530)Self-Care/Home Management (97535)Neuromuscular Re-Education (97112)Frequency:  2x per weekDuration:  4 weeksPlan for Next VisitNarrow BOS with head rotationsStep downs while looking to [facility] with head rotationsSLS with med ball rollBraidingLat walking vs tbandSlow marchGoals(4-6wks):STG's(2-3wks):1. Pt ind. HEP for appropriate balance, coordination and functional strengthening activities.2. Improved 30 sec sit to stand reps to 14, with no UE assist to facilitate safety with transfers.LTG's(4-6wks):3. Improved TUG score to 10sec to minimize fall risk with walking to bathroom.4. Improved DGI score to 19 to minimize fall risk when walking outside or uneven surfaces.5. 5 floor to stand transfers with supervision to facilitate getting up off floor after fall/exercises.  [1] Past Medical History:Diagnosis Date  Anemia   Bilateral hand pain 11/11/2017  Disease of thyroid gland   Diverticulosis   GERD (gastroesophageal reflux disease)   Heart valve disease   Hyperlipidemia   IBS (irritable bowel syndrome)   Mitral regurgitation   Skin cancer   SLE (systemic lupus erythematosus) (HC Code)  (HC CODE)  [2] Past Surgical History:Procedure Laterality Date  CATARACT REMOVAL/IOL IMPLANT    CHOLECYSTECTOMY    COLONOSCOPY  11/25/2010  Diverticulosis of the sigmoid colon  COLONOSCOPY  02/26/2015  Diverticulosis of the sigmoid colon. Polyps (3 mm to 4 mm) in the mid-ascending colon. (Polypectomy). Polyp (4 mm to 6 mm) in the distal ascending colon. (Polypectomy).   EGD/Colonoscopy  07/20/2005  Nonerosive antral gastritis; diverticulosis coli. Bx shows chronic non specific inflammation  EGD/Colonoscopy  09/11/1999  Normal EGD; sigmoid diverticulosis.   ESOPHAGOGASTRODUODENOSCOPY  04/16/2009  Abnormally visible vascularity with absence of folds in the gastric body compatible with gastric mucosal atrophy. (Biopsy)  MOLE REMOVAL  2006  OOPHORECTOMY  1961  SKIN CANCER EXCISION    R arm (2017) and R ankle (2003)  THYROIDECTOMY  1969

## 2024-03-29 NOTE — Result Encounter Note [77]
 Called and notified pt about EMG result. Pt will continue physical therapy.BO

## 2024-03-31 ENCOUNTER — Inpatient Hospital Stay: Admit: 2024-03-31 | Discharge: 2024-03-31 | Payer: PRIVATE HEALTH INSURANCE

## 2024-03-31 DIAGNOSIS — R2681 Unsteadiness on feet: Principal | ICD-10-CM

## 2024-03-31 NOTE — Rehab Daily Note [3041236]
 [facility] REHABILITATION [facility] [place] [name] Number: [phone] Number: [phone] Therapy Daily NotePatient Name:  Debra AllenMedical Record Number:  [id number] Date of Birth:  11-11-1947Therapist:  Medford Calandra, PTReferring Provider:  Schuyler Squires Coiculescu, MDICD-10 Diagnosis(es):Problem List       ICD-10-CM   PT - UI - 12/30/2023  Pelvic floor dysfunction M62.89  Nocturia R35.1  Urge incontinence N39.41   PT-Gait Instability-03/28/24  Gait instability R26.81 General InformationTherapy Episode of Care   Date of Visit:  03/31/2024    Treatment Number:  2    Date the Treatment Plan was Initiated/Reviewed:  03/28/2024   Progress Report Due Date:  04/29/2023 Medication Review:Current Outpatient Medications Medication Sig  acetaminophen Take 2 tablets (1,000 mg total) by mouth every 6 (six) hours as needed.  azelaic acid  APPLY TOPICALLY TO AFFECTED AREA TWICE DAILY  budesonide  Take 2 capsules (6 mg total) by mouth every morning. (Patient not taking: Reported on 03/14/2024)  calcium  carbonate Take 2.5 tablets (1,500 mg total) by mouth daily.  chlorhexidine  gluconate Swish and spit 15 mLs 2 (two) times daily.  citalopram Take 0.5 tablets (10 mg total) by mouth daily.  cyanocobalamin  Inject 1 mL (1,000 mcg total) into the muscle every 30 (thirty) days.  Cequa  Apply to eye. (Patient not taking: Reported on 03/14/2024)  denosumab  (PROLIA  SUBQ) Inject under the skin. Twice yearly (Patient not taking: Reported on 03/14/2024)  docosahexaenoic acid/epa (FISH OIL  ORAL) Take by mouth daily. -per pt two capsules daily.  docusate sodium  Take 1 capsule (100 mg total) by mouth 2 (two) times daily.  doxycycline  Take 1 capsule (50 mg total) by mouth daily.  famotidine  Take 1 tablet (20 mg total) by mouth 2 (two) times daily.  fluticasone  propionate Use 1 spray in each nostril daily as needed.  gabapentin  Take 1 capsule (300 mg total) by mouth 4 (four) times daily.  glucosamine-chondroitin Take 1 tablet by mouth daily.  hydroxychloroquine TAKE 1 TABLET TWICE A DAY  iron Take 25 mg by mouth Daily @1700 .  levothyroxine  Take 1 tablet (112 mcg total) by mouth daily.  Linzess  TAKE 1 CAPSULE BY MOUTH ONCE DAILY ON AN EMPTY STOMACH AT LEAST 30 MINUTES BEFORE A MEAL AT THE SAME TIME EACH DAY  midodrine  Take 1 tablet (2.5 mg total) by mouth 2 (two) times daily with breakfast and dinner. Do not take any doseslater than evening meal or less than 4 hours before bedtime.  Miebo  (PF)   multivitamin Take 1 capsule by mouth daily.  omega-3 fatty acids  Take 2 capsules (2 g total) by mouth daily.  pantoprazole  Take 1 tablet (40 mg total) by mouth daily.  penicillin  v potassium Take 1 tablet (500 mg total) by mouth every 12 (twelve) hours.  polyethylene glycol as needed.  Daily Fiber (psyllium-aspart) Take 1 packet by mouth as needed.  BD Luer-Lok Syringe Use as directed with B12  trospium  Take 1 tablet (20 mg total) by mouth nightly. SubjectivePt compliant with HEP.ObjectiveOutcomes Measure not required for this visit                                     Treatment Provided This VisitCPT Code Interventions Timed Minutes Untimed Minutes Total Minutes Therapeutic Exercise (97110) VOR activities for accomodation seatedChurch pew-->eyes closedTandem stance head rotationsHip hikeStep downs while looking to [facility] with head rotationsSLS with med ball rollBraidingLat walking vs tband redSlow march   30 N/A  N/A     N/A       Total Treatment Time: 30 AssessmentDid well with above activities, mild LOB with eyes closed and head rotations.  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 Differential includes inner ear issues. Compensatory gait pattern.- Scheduled physical therapy twice a week for one month to improve strength and balance.- Instructed on home exercises, narrow BOS and A<>P WS for low level balance activities.- Encouraged use of a cane for longer walks to prevent falls. Orthostatic hypotensionManaged with midodrine . Blood pressure low in the morning but improves with medication. Patient had a significant fall in April of this year, resulting in a head injury.- Continue midodrine  twice daily to manage orthostatic hypotension.- Monitor blood pressure regularly, especially in the morningPlanFrequency:  2x per weekDuration:  4 weeksPlan for Next VisitNarrow BOS with head rotationsStep downs while looking to [facility] with head rotationsSLS with med ball rollBraidingLat walking vs tbandSlow march

## 2024-04-03 ENCOUNTER — Inpatient Hospital Stay: Admit: 2024-04-03 | Discharge: 2024-04-03 | Payer: PRIVATE HEALTH INSURANCE

## 2024-04-03 DIAGNOSIS — R2681 Unsteadiness on feet: Principal | ICD-10-CM

## 2024-04-03 NOTE — Rehab Daily Note [[id number]]
 [facility] REHABILITATION [facility] [place] [name] Number: [phone] Number: [phone] Therapy Daily NotePatient Name:  Debra AllenMedical Record Number:  [id number] Date of Birth:  [birth year]Therapist:  Rumalda Jury, PTReferring Provider:  Schuyler Squires Coiculescu, MDICD-10 Diagnosis(es):Problem List       ICD-10-CM   PT-Gait Instability-03/28/24  Gait instability R26.81 General InformationTherapy Episode of Care   Date of Visit:  04/03/2024    Treatment Number:  3    Date the Treatment Plan was Initiated/Reviewed:  03/28/2024   Progress Report Due Date:  04/29/2023 Medication Review:Current Outpatient Medications Medication Sig  acetaminophen Take 2 tablets (1,000 mg total) by mouth every 6 (six) hours as needed.  azelaic acid  APPLY TOPICALLY TO AFFECTED AREA TWICE DAILY  budesonide  Take 2 capsules (6 mg total) by mouth every morning. (Patient not taking: Reported on 03/14/2024)  calcium  carbonate Take 2.5 tablets (1,500 mg total) by mouth daily.  chlorhexidine  gluconate Swish and spit 15 mLs 2 (two) times daily.  citalopram Take 0.5 tablets (10 mg total) by mouth daily.  cyanocobalamin  Inject 1 mL (1,000 mcg total) into the muscle every 30 (thirty) days.  Cequa  Apply to eye. (Patient not taking: Reported on 03/14/2024)  denosumab  (PROLIA  SUBQ) Inject under the skin. Twice yearly (Patient not taking: Reported on 03/14/2024)  docosahexaenoic acid/epa (FISH OIL  ORAL) Take by mouth daily. -per pt two capsules daily.  docusate sodium  Take 1 capsule (100 mg total) by mouth 2 (two) times daily.  doxycycline  Take 1 capsule (50 mg total) by mouth daily.  famotidine  Take 1 tablet (20 mg total) by mouth 2 (two) times daily.  fluticasone  propionate Use 1 spray in each nostril daily as needed.  gabapentin  Take 1 capsule (300 mg total) by mouth 4 (four) times daily.  glucosamine-chondroitin Take 1 tablet by mouth daily.  hydroxychloroquine TAKE 1 TABLET TWICE A DAY  iron Take 25 mg by mouth Daily @1700 .  levothyroxine  Take 1 tablet (112 mcg total) by mouth daily.  Linzess  TAKE 1 CAPSULE BY MOUTH ONCE DAILY ON AN EMPTY STOMACH AT LEAST 30 MINUTES BEFORE A MEAL AT THE SAME TIME EACH DAY  midodrine  Take 1 tablet (2.5 mg total) by mouth 2 (two) times daily with breakfast and dinner. Do not take any doseslater than evening meal or less than 4 hours before bedtime.  Miebo  (PF)   multivitamin Take 1 capsule by mouth daily.  omega-3 fatty acids  Take 2 capsules (2 g total) by mouth daily.  pantoprazole  Take 1 tablet (40 mg total) by mouth daily.  penicillin  v potassium Take 1 tablet (500 mg total) by mouth every 12 (twelve) hours.  polyethylene glycol as needed.  Daily Fiber (psyllium-aspart) Take 1 packet by mouth as needed.  BD Luer-Lok Syringe Use as directed with B12  trospium  Take 1 tablet (20 mg total) by mouth nightly. SubjectivePt compliant with HEP. Reporting no changesObjectiveOutcomes Measure not required for this visit                                     Treatment Provided This VisitCPT Code Interventions Timed Minutes Untimed Minutes Total Minutes Therapeutic Exercise (97110) Tandem stanceTandem stance with head turnsSLS w/ med ball roll ant/postSLS w/ med ball roll lat/medLateral step w/ yellow bandHurdles:   - big march and tap   - tap and step over   - step over and back   - side step overSLS 30  30  N/A     N/A     N/A       Total Treatment Time: 30 AssessmentDid well with above activities, mild LOB with head rotations and some hurdles. 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 Differential includes inner ear issues. Compensatory gait pattern.- Scheduled physical therapy twice a week for one month to improve strength and balance.- Instructed on home exercises, narrow BOS and A<>P WS for low level balance activities.- Encouraged use of a cane for longer walks to prevent falls. Orthostatic hypotensionManaged with midodrine . Blood pressure low in the morning but improves with medication. Patient had a significant fall in April of this year, resulting in a head injury.- Continue midodrine  twice daily to manage orthostatic hypotension.- Monitor blood pressure regularly, especially in the morningPlanFrequency:  2x per weekDuration:  4 weeksPlan for Next VisitNarrow BOS with head rotationsStep downs while looking to [facility] with head rotationsSLS with med ball rollBraidingLat walking vs tbandSlow march

## 2024-04-04 ENCOUNTER — Telehealth: Admit: 2024-04-04 | Payer: PRIVATE HEALTH INSURANCE | Attending: Cardiovascular Disease

## 2024-04-04 NOTE — Telephone Encounter [36]
 Contacted patient to schedule echo and office appt with JF in [place]. Patient reports she is leaving on 12/30 for [place]  and does not know when she will be returning, she thinks she will be staying for at least 2 months. I asked patient to please contact office when she is back in [place] so we can coordinate appts. Patient appreciative of call

## 2024-04-05 ENCOUNTER — Ambulatory Visit: Admit: 2024-04-05 | Payer: PRIVATE HEALTH INSURANCE

## 2024-04-06 ENCOUNTER — Telehealth: Admit: 2024-04-06 | Payer: PRIVATE HEALTH INSURANCE | Attending: Neurology

## 2024-04-06 NOTE — Telephone Encounter [36]
 Copied from CRM #[id number]. Topic: General Message - [facility]>> Apr 06, 2024 10:27 AM Debby PEDLAR wrote:[facility]ENTER MESSAGETime of call:   10:27 AMReceived call from [name] who is reporting bruising purple/ yellow to upper L thigh following EMG on 12/1. Denies pain, swelling, or redness. Patient does not think the area of bruising is where needles were placed during study. Would like MD thoughts/ advice.Best number to contact: [phone] [facility] Nurse Advisor

## 2024-04-06 NOTE — Telephone Encounter [36]
 The writer returned the patient's phone call.  Left a voice mail to return the call.

## 2024-04-07 ENCOUNTER — Inpatient Hospital Stay: Admit: 2024-04-07 | Discharge: 2024-04-07 | Payer: PRIVATE HEALTH INSURANCE

## 2024-04-07 DIAGNOSIS — R2681 Unsteadiness on feet: Principal | ICD-10-CM

## 2024-04-07 NOTE — Rehab Daily Note [[id number]]
 [facility] REHABILITATION [facility] [place] [name] Number: [phone] Number: [phone] Therapy Daily NotePatient Name:  Debra AllenMedical Record Number:  [id number] Date of Birth:  1947/11/06Therapist:  Medford Calandra, PTReferring Provider:  Schuyler Squires Coiculescu, MDICD-10 Diagnosis(es):Problem List       ICD-10-CM   PT-Gait Instability-03/28/24  Gait instability R26.81 General InformationTherapy Episode of Care   Date of Visit:  04/07/2024    Treatment Number:  4    Date the Treatment Plan was Initiated/Reviewed:  03/28/2024   Progress Report Due Date:  04/29/2023 Medication Review:Current Outpatient Medications Medication Sig  acetaminophen Take 2 tablets (1,000 mg total) by mouth every 6 (six) hours as needed.  azelaic acid  APPLY TOPICALLY TO AFFECTED AREA TWICE DAILY  budesonide  Take 2 capsules (6 mg total) by mouth every morning. (Patient not taking: Reported on 03/14/2024)  calcium  carbonate Take 2.5 tablets (1,500 mg total) by mouth daily.  chlorhexidine  gluconate Swish and spit 15 mLs 2 (two) times daily.  citalopram Take 0.5 tablets (10 mg total) by mouth daily.  cyanocobalamin  Inject 1 mL (1,000 mcg total) into the muscle every 30 (thirty) days.  Cequa  Apply to eye. (Patient not taking: Reported on 03/14/2024)  denosumab  (PROLIA  SUBQ) Inject under the skin. Twice yearly (Patient not taking: Reported on 03/14/2024)  docosahexaenoic acid/epa (FISH OIL  ORAL) Take by mouth daily. -per pt two capsules daily.  docusate sodium  Take 1 capsule (100 mg total) by mouth 2 (two) times daily.  doxycycline  Take 1 capsule (50 mg total) by mouth daily.  famotidine  Take 1 tablet (20 mg total) by mouth 2 (two) times daily.  fluticasone  propionate Use 1 spray in each nostril daily as needed.  gabapentin  Take 1 capsule (300 mg total) by mouth 4 (four) times daily.  glucosamine-chondroitin Take 1 tablet by mouth daily.  hydroxychloroquine TAKE 1 TABLET TWICE A DAY  iron Take 25 mg by mouth Daily @1700 .  levothyroxine  Take 1 tablet (112 mcg total) by mouth daily.  Linzess  TAKE 1 CAPSULE BY MOUTH ONCE DAILY ON AN EMPTY STOMACH AT LEAST 30 MINUTES BEFORE A MEAL AT THE SAME TIME EACH DAY  midodrine  Take 1 tablet (2.5 mg total) by mouth 2 (two) times daily with breakfast and dinner. Do not take any doseslater than evening meal or less than 4 hours before bedtime.  Miebo  (PF)   multivitamin Take 1 capsule by mouth daily.  omega-3 fatty acids  Take 2 capsules (2 g total) by mouth daily.  pantoprazole  Take 1 tablet (40 mg total) by mouth daily.  penicillin  v potassium Take 1 tablet (500 mg total) by mouth every 12 (twelve) hours.  polyethylene glycol as needed.  Daily Fiber (psyllium-aspart) Take 1 packet by mouth as needed.  BD Luer-Lok Syringe Use as directed with B12  trospium  Take 1 tablet (20 mg total) by mouth nightly. SubjectivePt compliant with HEP.  Notes some mid thoracic region discomfort after lifting walker out of car for friend.ObjectiveOutcomes Measure not required for this visit                                     Treatment Provided This VisitCPT Code Interventions Timed Minutes Untimed Minutes Total Minutes Therapeutic Exercise (97110) Split standing Ant WS with ball OH eyes on fixed pt--> eyes on ballForward step with bilat UE OHSplit stance with alternating lateral flexionSLS trials, starting with Unilat UE assist then no assist, up to 9 sec each  leg.Church pew-->eyes closedTandem stance head rotations->progressed this to standing on air ex.   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 Differential includes inner ear issues. Compensatory gait pattern.- Scheduled physical therapy twice a week for one month to improve strength and balance.- Instructed on home exercises, narrow BOS and A<>P WS for low level balance activities.- Encouraged use of a cane for longer walks to prevent falls. Orthostatic hypotensionManaged with midodrine . Blood pressure low in the morning but improves with medication. Patient had a significant fall in April of this year, resulting in a head injury.- Continue midodrine  twice daily to manage orthostatic hypotension.- Monitor blood pressure regularly, especially in the morningPlanFrequency:  2x per weekDuration:  4 weeksPlan for Next VisitNarrow BOS with head rotationsStep downs while looking to [facility] with head rotationsSLS with med ball rollBraidingLat walking vs tbandSlow march

## 2024-04-10 ENCOUNTER — Inpatient Hospital Stay: Admit: 2024-04-10 | Discharge: 2024-04-10 | Payer: PRIVATE HEALTH INSURANCE

## 2024-04-10 DIAGNOSIS — R2681 Unsteadiness on feet: Principal | ICD-10-CM

## 2024-04-10 NOTE — Rehab Daily Note [[id number]]
 [facility] REHABILITATION [facility] [place] [name] Number: [phone] Number: [phone] Therapy Daily NotePatient Name:  Debra AllenMedical Record Number:  [id number] Date of Birth:  24-Feb-1947Therapist:  Medford Calandra, PTReferring Provider:  Schuyler Squires Coiculescu, MDICD-10 Diagnosis(es):Problem List       ICD-10-CM   PT-Gait Instability-03/28/24  Gait instability R26.81 General InformationTherapy Episode of Care   Date of Visit:  04/10/2024    Treatment Number:  5    Date the Treatment Plan was Initiated/Reviewed:  03/28/2024   Progress Report Due Date:  04/29/2023 Medication Review:Current Outpatient Medications Medication Sig  acetaminophen Take 2 tablets (1,000 mg total) by mouth every 6 (six) hours as needed.  azelaic acid  APPLY TOPICALLY TO AFFECTED AREA TWICE DAILY  budesonide  Take 2 capsules (6 mg total) by mouth every morning. (Patient not taking: Reported on 03/14/2024)  calcium  carbonate Take 2.5 tablets (1,500 mg total) by mouth daily.  chlorhexidine  gluconate Swish and spit 15 mLs 2 (two) times daily.  citalopram Take 0.5 tablets (10 mg total) by mouth daily.  cyanocobalamin  Inject 1 mL (1,000 mcg total) into the muscle every 30 (thirty) days.  Cequa  Apply to eye. (Patient not taking: Reported on 03/14/2024)  denosumab  (PROLIA  SUBQ) Inject under the skin. Twice yearly (Patient not taking: Reported on 03/14/2024)  docosahexaenoic acid/epa (FISH OIL  ORAL) Take by mouth daily. -per pt two capsules daily.  docusate sodium  Take 1 capsule (100 mg total) by mouth 2 (two) times daily.  doxycycline  Take 1 capsule (50 mg total) by mouth daily.  famotidine  Take 1 tablet (20 mg total) by mouth 2 (two) times daily.  fluticasone  propionate Use 1 spray in each nostril daily as needed.  gabapentin  Take 1 capsule (300 mg total) by mouth 4 (four) times daily.  glucosamine-chondroitin Take 1 tablet by mouth daily.  hydroxychloroquine TAKE 1 TABLET TWICE A DAY  iron Take 25 mg by mouth Daily @1700 .  levothyroxine  Take 1 tablet (112 mcg total) by mouth daily.  Linzess  TAKE 1 CAPSULE BY MOUTH ONCE DAILY ON AN EMPTY STOMACH AT LEAST 30 MINUTES BEFORE A MEAL AT THE SAME TIME EACH DAY  midodrine  Take 1 tablet (2.5 mg total) by mouth 2 (two) times daily with breakfast and dinner. Do not take any doseslater than evening meal or less than 4 hours before bedtime.  Miebo  (PF)   multivitamin Take 1 capsule by mouth daily.  omega-3 fatty acids  Take 2 capsules (2 g total) by mouth daily.  pantoprazole  Take 1 tablet (40 mg total) by mouth daily.  penicillin  v potassium Take 1 tablet (500 mg total) by mouth every 12 (twelve) hours.  polyethylene glycol as needed.  Daily Fiber (psyllium-aspart) Take 1 packet by mouth as needed.  BD Luer-Lok Syringe Use as directed with B12  trospium  Take 1 tablet (20 mg total) by mouth nightly. SubjectivePt reporting more LBP now, 7/10PL.  Taking Alleve.ObjectiveOutcomes Measure not required for this visit                                     Treatment Provided This VisitCPT Code Interventions Timed Minutes Untimed Minutes Total Minutes Therapeutic Exercise (97110) Standing hamstring stretchLat walking vs tband(red)Split standing Ant WS with ball rotation eyes on fixed pt--> eyes on ballPseudo Big activities forward step and reach, lateral step and reach.Step downs while looking to [facility] with head rotationsSlow march holding pball.Seated pelvic tilt series for LBP.   30  N/A Progressed illustrated HEP today, see medbridge.    N/A     N/A       Total Treatment Time: 30 AssessmentDid very well with step down with head rotations.  Noted stretch sensation with gentle standing hamstring stretch.  Progressing steadily toward goals.  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 Differential includes inner ear issues. Compensatory gait pattern.- Scheduled physical therapy twice a week for one month to improve strength and balance.- Instructed on home exercises, narrow BOS and A<>P WS for low level balance activities.- Encouraged use of a cane for longer walks to prevent falls. Orthostatic hypotensionManaged with midodrine . Blood pressure low in the morning but improves with medication. Patient had a significant fall in April of this year, resulting in a head injury.- Continue midodrine  twice daily to manage orthostatic hypotension.- Monitor blood pressure regularly, especially in the morningPlanFrequency:  2x per weekDuration:  4 weeksPlan for Next VisitNarrow BOS with head rotationsSLS with med ball iso hold with march.SLS with med ball rollBraiding

## 2024-04-10 NOTE — Progress Notes [1]
 Program_ID:[id number] Access Code: [email or web address]: [email or web address]: 12-15-2025Prepared By: Medford Mass NotesExercises    - Church Pew - 1 x daily - 7 x weekly - 3 sets - 10 reps    - Tandem Stance - 1 x daily - 7 x weekly - 3 sets - 10 reps    - Single Leg Stance - 1 x daily - 7 x weekly - 3 sets - 10 reps    - Standing Hip Flexion March - 1 x daily - 7 x weekly - 3 sets - 10 reps    - Step Forward with Opposite Arm Reach - 1 x daily - 7 x weekly - 3 sets - 10 reps    - Step Sideways with Arms Reaching - 1 x daily - 7 x weekly - 3 sets - 10 reps

## 2024-04-12 ENCOUNTER — Inpatient Hospital Stay: Admit: 2024-04-12 | Discharge: 2024-04-12 | Payer: PRIVATE HEALTH INSURANCE

## 2024-04-12 DIAGNOSIS — R2681 Unsteadiness on feet: Principal | ICD-10-CM

## 2024-04-12 NOTE — Rehab Daily Note [[id number]]
 [facility] REHABILITATION [facility] [place] [name] Number: [id number] Number: [phone] Therapy Daily NotePatient Name:  Debra AllenMedical Record Number:  [id number] Date of Birth:  December 14, 1947Therapist:  Medford Calandra, PTReferring Provider:  Schuyler Squires Coiculescu, MDICD-10 Diagnosis(es):Problem List       ICD-10-CM   PT-Gait Instability-03/28/24  Gait instability R26.81 General InformationTherapy Episode of Care   Date of Visit:  04/12/2024    Treatment Number:  6    Date the Treatment Plan was Initiated/Reviewed:  03/28/2024   Progress Report Due Date:  04/29/2023 Medication Review:Current Outpatient Medications Medication Sig  acetaminophen Take 2 tablets (1,000 mg total) by mouth every 6 (six) hours as needed.  azelaic acid  APPLY TOPICALLY TO AFFECTED AREA TWICE DAILY  budesonide  Take 2 capsules (6 mg total) by mouth every morning. (Patient not taking: Reported on 03/14/2024)  calcium  carbonate Take 2.5 tablets (1,500 mg total) by mouth daily.  chlorhexidine  gluconate Swish and spit 15 mLs 2 (two) times daily.  citalopram Take 0.5 tablets (10 mg total) by mouth daily.  cyanocobalamin  Inject 1 mL (1,000 mcg total) into the muscle every 30 (thirty) days.  Cequa  Apply to eye. (Patient not taking: Reported on 03/14/2024)  denosumab  (PROLIA  SUBQ) Inject under the skin. Twice yearly (Patient not taking: Reported on 03/14/2024)  docosahexaenoic acid/epa (FISH OIL  ORAL) Take by mouth daily. -per pt two capsules daily.  docusate sodium  Take 1 capsule (100 mg total) by mouth 2 (two) times daily.  doxycycline  Take 1 capsule (50 mg total) by mouth daily.  famotidine  Take 1 tablet (20 mg total) by mouth 2 (two) times daily.  fluticasone  propionate Use 1 spray in each nostril daily as needed.  gabapentin  Take 1 capsule (300 mg total) by mouth 4 (four) times daily.  glucosamine-chondroitin Take 1 tablet by mouth daily.  hydroxychloroquine TAKE 1 TABLET TWICE A DAY  iron Take 25 mg by mouth Daily @1700 .  levothyroxine  Take 1 tablet (112 mcg total) by mouth daily.  Linzess  TAKE 1 CAPSULE BY MOUTH ONCE DAILY ON AN EMPTY STOMACH AT LEAST 30 MINUTES BEFORE A MEAL AT THE SAME TIME EACH DAY  midodrine  Take 1 tablet (2.5 mg total) by mouth 2 (two) times daily with breakfast and dinner. Do not take any doseslater than evening meal or less than 4 hours before bedtime.  Miebo  (PF)   multivitamin Take 1 capsule by mouth daily.  omega-3 fatty acids  Take 2 capsules (2 g total) by mouth daily.  pantoprazole  Take 1 tablet (40 mg total) by mouth daily.  penicillin  v potassium Take 1 tablet (500 mg total) by mouth every 12 (twelve) hours.  polyethylene glycol as needed.  Daily Fiber (psyllium-aspart) Take 1 packet by mouth as needed.  BD Luer-Lok Syringe Use as directed with B12  trospium  Take 1 tablet (20 mg total) by mouth nightly. SubjectivePt continues to note thoracic, LBP and now neck pain since lifting walker out of car for friend.ObjectiveOutcomes Measure not required for this visit                                     Treatment Provided This VisitCPT Code Interventions Timed Minutes Untimed Minutes Total Minutes Therapeutic Exercise (97110) Standing hamstring stretchLat walking vs tband(red)Split standing Ant WS with ball rotation eyes on fixed pt--> eyes on ballStep downs while looking to [facility] with head rotationsWalking with head rotationsWalking with 180? turn and stopWalking with stepping up/over step.Lateral  step over step   30 N/A     N/A     N/A       Total Treatment Time: 30 AssessmentDid very well with step down with head rotations.  Difficulty with lateral step overs, however end of session, fatigue and LE alignment played a role.  Progressing steadily toward goals.  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 Differential includes inner ear issues. Compensatory gait pattern.- Scheduled physical therapy twice a week for one month to improve strength and balance.- Instructed on home exercises, narrow BOS and A<>P WS for low level balance activities.- Encouraged use of a cane for longer walks to prevent falls. Orthostatic hypotensionManaged with midodrine . Blood pressure low in the morning but improves with medication. Patient had a significant fall in April of this year, resulting in a head injury.- Continue midodrine  twice daily to manage orthostatic hypotension.- Monitor blood pressure regularly, especially in the morningPlanFrequency:  2x per weekDuration:  4 weeksPlan for Next VisitNarrow BOS with head rotationsSLS with med ball iso hold with march.SLS with med ball rollBraiding

## 2024-04-13 ENCOUNTER — Inpatient Hospital Stay: Admit: 2024-04-13 | Discharge: 2024-04-13 | Payer: Medicare (Managed Care)

## 2024-04-13 ENCOUNTER — Encounter: Admit: 2024-04-13 | Payer: PRIVATE HEALTH INSURANCE

## 2024-04-13 DIAGNOSIS — I519 Heart disease, unspecified: Secondary | ICD-10-CM

## 2024-04-13 DIAGNOSIS — E039 Hypothyroidism, unspecified: Principal | ICD-10-CM

## 2024-04-13 DIAGNOSIS — E871 Hypo-osmolality and hyponatremia: Secondary | ICD-10-CM

## 2024-04-13 LAB — BASIC METABOLIC PANEL
BKR ANION GAP (LM): 6 mmol/L (ref 5–15)
BKR BLOOD UREA NITROGEN: 15 mg/dL (ref 7–18)
BKR CALCIUM: 9.5 mg/dL (ref 8.5–10.1)
BKR CHLORIDE: 102 mmol/L (ref 98–107)
BKR CO2: 29 mmol/L (ref 21–32)
BKR CREATININE DELTA: 0.17
BKR CREATININE: 0.65 mg/dL (ref 0.55–1.02)
BKR EGFR, CREATININE (CKD-EPI 2021): >60 mL/min/1.73m2 (ref >=60)
BKR GLUCOSE: 103 mg/dL (ref 65–110)
BKR POTASSIUM: 4.3 mmol/L (ref 3.5–5.1)
BKR SODIUM: 137 mmol/L (ref 136–145)

## 2024-04-13 LAB — T4, FREE: BKR FREE T4: 1.1 ng/dL (ref 0.76–1.46)

## 2024-04-13 LAB — OSMOLALITY: BKR OSMOLALITY: 287 mosm/kg (ref 275–295)

## 2024-04-13 LAB — OSMOLALITY, URINE, RANDOM: BKR OSMOLALITY, URINE, RANDOM: 312 mosm/kg (ref 300–1000)

## 2024-04-13 LAB — TSH W/REFLEX TO FT4     (BH GH LMW Q YH): BKR THYROID STIMULATING HORMONE (LM): 4.38 u[IU]/mL — ABNORMAL HIGH (ref 0.36–3.74)

## 2024-04-18 ENCOUNTER — Inpatient Hospital Stay: Admit: 2024-04-18 | Discharge: 2024-04-18 | Payer: PRIVATE HEALTH INSURANCE

## 2024-04-18 DIAGNOSIS — R2681 Unsteadiness on feet: Principal | ICD-10-CM

## 2024-04-18 NOTE — Rehab Daily Note [3041236]
 [facility] REHABILITATION [facility] [place] [name] Number: [id number] Number: [phone] Therapy Daily NotePatient Name:  Debra AllenMedical Record Number:  [id number] Date of Birth:  1947-02-02Therapist:  Medford Calandra, PTReferring Provider:  Schuyler Squires Coiculescu, MDICD-10 Diagnosis(es):Problem List       ICD-10-CM   PT-Gait Instability-03/28/24  Gait instability R26.81 General InformationTherapy Episode of Care   Date of Visit:  04/18/2024    Treatment Number:  7    Date the Treatment Plan was Initiated/Reviewed:  03/28/2024   Progress Report Due Date:  04/29/2023 Medication Review:Current Outpatient Medications Medication Sig  acetaminophen Take 2 tablets (1,000 mg total) by mouth every 6 (six) hours as needed.  azelaic acid  APPLY TOPICALLY TO AFFECTED AREA TWICE DAILY  budesonide  Take 2 capsules (6 mg total) by mouth every morning. (Patient not taking: Reported on 03/14/2024)  calcium  carbonate Take 2.5 tablets (1,500 mg total) by mouth daily.  chlorhexidine  gluconate Swish and spit 15 mLs 2 (two) times daily.  citalopram Take 0.5 tablets (10 mg total) by mouth daily.  cyanocobalamin  Inject 1 mL (1,000 mcg total) into the muscle every 30 (thirty) days.  Cequa  Apply to eye. (Patient not taking: Reported on 03/14/2024)  denosumab  (PROLIA  SUBQ) Inject under the skin. Twice yearly (Patient not taking: Reported on 03/14/2024)  docosahexaenoic acid/epa (FISH OIL  ORAL) Take by mouth daily. -per pt two capsules daily.  docusate sodium  Take 1 capsule (100 mg total) by mouth 2 (two) times daily.  doxycycline  Take 1 capsule (50 mg total) by mouth daily.  famotidine  Take 1 tablet (20 mg total) by mouth 2 (two) times daily.  fluticasone  propionate Use 1 spray in each nostril daily as needed.  gabapentin  Take 1 capsule (300 mg total) by mouth 4 (four) times daily.  glucosamine-chondroitin Take 1 tablet by mouth daily.  hydroxychloroquine TAKE 1 TABLET TWICE A DAY  iron Take 25 mg by mouth Daily @1700 .  levothyroxine  Take 1 tablet (112 mcg total) by mouth daily.  Linzess  TAKE 1 CAPSULE BY MOUTH ONCE DAILY ON AN EMPTY STOMACH AT LEAST 30 MINUTES BEFORE A MEAL AT THE SAME TIME EACH DAY  midodrine  Take 1 tablet (2.5 mg total) by mouth 2 (two) times daily with breakfast and dinner. Do not take any doseslater than evening meal or less than 4 hours before bedtime.  Miebo  (PF)   multivitamin Take 1 capsule by mouth daily.  omega-3 fatty acids  Take 2 capsules (2 g total) by mouth daily.  pantoprazole  Take 1 tablet (40 mg total) by mouth daily.  penicillin  v potassium Take 1 tablet (500 mg total) by mouth every 12 (twelve) hours.  polyethylene glycol as needed.  Daily Fiber (psyllium-aspart) Take 1 packet by mouth as needed.  BD Luer-Lok Syringe Use as directed with B12  trospium  Take 1 tablet (20 mg total) by mouth nightly. SubjectivePt continues to Right sided thoracic pain/ LBP since lifting walker out of car for friend.  Compliant with balance HEP activities.ObjectiveOutcomes Measure not required for this visit                                     Treatment Provided This VisitCPT Code Interventions Timed Minutes Untimed Minutes Total Minutes Therapeutic Exercise (97110) HEP extensive review.:-church pew-SLS 10 sec each leg-Slow march-forward step and reachWalking with head rotationsWalking with 180? turn and stop   20 Manual Therapy (97140) STM R thoracic, lumbar parsapinals. Lat dorsi with pt  in L sidelying.   10 N/A     N/A       Total Treatment Time: 30 AssessmentStill noting occasional path deviation with walking with head rotations.  Significantly improved SLS ability, however tends to flex R knee to approx 15?, able to correct this with cueing.  Noted some relief of thoracic pain after STM today. 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 Differential includes inner ear issues. Compensatory gait pattern.- Scheduled physical therapy twice a week for one month to improve strength and balance.- Instructed on home exercises, narrow BOS and A<>P WS for low level balance activities.- Encouraged use of a cane for longer walks to prevent falls. Orthostatic hypotensionManaged with midodrine . Blood pressure low in the morning but improves with medication. Patient had a significant fall in April of this year, resulting in a head injury.- Continue midodrine  twice daily to manage orthostatic hypotension.- Monitor blood pressure regularly, especially in the morningPlanFrequency:  2x per weekDuration:  4 weeksPlan for Next VisitNarrow BOS with head rotationsSLS with med ball iso hold with march.SLS with med ball rollBraidingStanding hamstring stretchLat walking vs tband(red)Split standing Ant WS with ball rotation eyes on fixed pt--> eyes on ballStep downs while looking to [facility] with head rotationsWalking with stepping up/over step.Lateral step over step

## 2024-04-21 ENCOUNTER — Ambulatory Visit: Admit: 2024-04-21 | Payer: PRIVATE HEALTH INSURANCE

## 2024-04-24 ENCOUNTER — Encounter: Admit: 2024-04-24 | Payer: PRIVATE HEALTH INSURANCE | Attending: Interventional Pain Medicine

## 2024-04-24 ENCOUNTER — Inpatient Hospital Stay: Admit: 2024-04-24 | Discharge: 2024-04-24 | Payer: PRIVATE HEALTH INSURANCE

## 2024-04-24 DIAGNOSIS — M546 Pain in thoracic spine: Principal | ICD-10-CM

## 2024-04-24 DIAGNOSIS — R2681 Unsteadiness on feet: Secondary | ICD-10-CM

## 2024-04-24 NOTE — Rehab Cancel [[id number]]
Pt cancelled due to being sick.

## 2024-04-25 ENCOUNTER — Inpatient Hospital Stay: Admit: 2024-04-25 | Discharge: 2024-04-25 | Payer: Medicare (Managed Care)

## 2024-04-25 DIAGNOSIS — M546 Pain in thoracic spine: Secondary | ICD-10-CM

## 2024-04-26 ENCOUNTER — Inpatient Hospital Stay: Admit: 2024-04-26 | Discharge: 2024-04-26 | Payer: PRIVATE HEALTH INSURANCE

## 2024-04-26 DIAGNOSIS — R2681 Unsteadiness on feet: Principal | ICD-10-CM

## 2024-04-26 NOTE — Rehab Eval/Progress [[id number]]
 [facility] REHABILITATION [facility] [place] [name] Number: [id number] Number: [phone] Therapy Orthopedic Re-EvaluationPatient Name:  Debra AllenMedical Record Number:  [id number] Date of Birth:  [birth year]Therapist:  Medford Calandra, PTReferring Provider:  Schuyler Squires Coiculescu, MDICD-10 Diagnosis(es):Problem List       ICD-10-CM   PT-Gait Instability-03/28/24  Gait instability R26.81 General InformationTherapy Episode of Care   Date of Visit:  04/26/2024    Treatment Number:  8    Date the Treatment Plan was Initiated/Reviewed:  03/28/2024   Progress Report Due Date:  05/27/2024 Medication Review:Current Outpatient Medications Medication Sig  acetaminophen Take 2 tablets (1,000 mg total) by mouth every 6 (six) hours as needed.  azelaic acid  APPLY TOPICALLY TO AFFECTED AREA TWICE DAILY  budesonide  Take 2 capsules (6 mg total) by mouth every morning. (Patient not taking: Reported on 03/14/2024)  calcium  carbonate Take 2.5 tablets (1,500 mg total) by mouth daily.  chlorhexidine  gluconate Swish and spit 15 mLs 2 (two) times daily.  citalopram Take 0.5 tablets (10 mg total) by mouth daily.  cyanocobalamin  Inject 1 mL (1,000 mcg total) into the muscle every 30 (thirty) days.  Cequa  Apply to eye. (Patient not taking: Reported on 03/14/2024)  denosumab  (PROLIA  SUBQ) Inject under the skin. Twice yearly (Patient not taking: Reported on 03/14/2024)  docosahexaenoic acid/epa (FISH OIL  ORAL) Take by mouth daily. -per pt two capsules daily.  docusate sodium  Take 1 capsule (100 mg total) by mouth 2 (two) times daily.  doxycycline  Take 1 capsule (50 mg total) by mouth daily.  famotidine  Take 1 tablet (20 mg total) by mouth 2 (two) times daily.  fluticasone  propionate Use 1 spray in each nostril daily as needed.  gabapentin  Take 1 capsule (300 mg total) by mouth 4 (four) times daily.  glucosamine-chondroitin Take 1 tablet by mouth daily.  hydroxychloroquine TAKE 1 TABLET TWICE A DAY  iron Take 25 mg by mouth Daily @1700 .  levothyroxine  Take 1 tablet (112 mcg total) by mouth daily.  Linzess  TAKE 1 CAPSULE BY MOUTH ONCE DAILY ON AN EMPTY STOMACH AT LEAST 30 MINUTES BEFORE A MEAL AT THE SAME TIME EACH DAY  midodrine  Take 1 tablet (2.5 mg total) by mouth 2 (two) times daily with breakfast and dinner. Do not take any doseslater than evening meal or less than 4 hours before bedtime.  Miebo  (PF)   multivitamin Take 1 capsule by mouth daily.  omega-3 fatty acids  Take 2 capsules (2 g total) by mouth daily.  pantoprazole  Take 1 tablet (40 mg total) by mouth daily.  penicillin  v potassium Take 1 tablet (500 mg total) by mouth every 12 (twelve) hours.  polyethylene glycol as needed.  Daily Fiber (psyllium-aspart) Take 1 packet by mouth as needed.  BD Luer-Lok Syringe Use as directed with B12  trospium  Take 1 tablet (20 mg total) by mouth nightly. SubjectivePt reporting ongoing mid to lower thoracic pain, reporting she had an Xray ordered by Dr. Chandra recently.  Also awaiting upcoming TP injections in early January.  Notes compliance with HEP for balance activities. 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 She was referred evaluation/treatment of her balance issues.  Reports one fall in April 2025, hit head, fell backward. Balance instability and gait disturbance- Persistent unsteadiness, especially pronounced upon rising in the morning- Veering to either side while ambulating.- Symptoms present  for several years- Veering to the left when looking to the right during ambulation- Occasional lightheadedness- Orthostatic hypotension with low morning blood pressure; managed with midodrine  twice daily- Utilizes a nightlight in the bathroom to reduce fall risk- Recent nerve conduction study performed; results pendingChronic neck and low back pain- Baseline pain severity 4/10- Ongoing management by Dr. Flynn- Received neck injection in September 2025- History of prior epidural injections and nerve blocksFall history- [place] in April 2025 while ascending back stairs, striking head on cement sidewalk- Did not seek immediate medical attention- Subsequently underwent back x-rayFunctional mobility and activities- Lives with minimal assistance- Manages six stairs using left handrail- Occasionally uses a cane for longer distances, such as in airports- Performs morning bed exercises including neck range of motion, knee to chest, lower trunk rotation, straight leg raises, and figure four stretches, which improve flexibilityResultsRADIOLOGYLacunar infarction: Right superior cerebellar hemisphereOther Providers: Avelina Arlean Denison, NP(PCP)Past Medical HistoryPast Medical History[1]Past Surgical HistoryPast Surgical History[2]AllergiesTramadol, Cyclobenzaprine, and Niacin preparationsPain Rating:  4 / 10   Comments:  baseline LBP and neck pain, seeing Dr. Flynn, injections most recently in neck in Sept of 25. Social / Emotional Information:  Lives alone, Adult children living in [place] and [place]. 6 STE, left sided HR going up. Prior Level of Function:  ongoing difficulty walking, dizziness(hx of orthorstatic hypotension.   Change in Status from prior level of function?  SomewhatObjectivePosture: WFL'sMMT/ROM:Lumbar AROM WFLsC spine AROM WFL's, mild stiffness with left rotation(R 40?, L 30?)UE AROM WFL'SLE strength 4/5 throughout except hamstrings 3+/5UE strength 3+/5 throughout.Palpation:No pertinent findingsSensation:Light Touch:  IntactDeep Pressure:  IntactProprioception:  IntactMyotomes:  IntactDermatomes:  IntactBalance:DL Static eyes open:  GoodTandem eyes open:  R back 10 sec, L back 5 sec.  Eyes closed 1 sec R back, 4sec L back.Single leg eyes open:  5 sec R, 7 sec LAuditory:WFL'sVisual:WFL's, cataract surgerySpecial TestsMildly positive dysdiadochokinesia L foot taps, left handSlight nystagmus when tracking pen to left and moving head to R.Negative RhombergPositive Sharpened RhombergFunctional MobilityBed Mobility:  IndTransfers:   IndAmbulation:   Ind     Assistive Device:  Occasional cane use with longer walks.Gait AssessmentNear tandem bos during ambulation, mild path deviation, short stride. Minimal foot clearance.Orthopedic Tools/Scales/Outcome MeasuresInterim/Reassessment/Recert Outcomes Measure completedPatient Specific Functional Scale (PSFS)   Activity #1 Score:  7      Details:   Walking in straight line tend to veer to left when looking R.   Activity #2 Score:  7      Details:   Feeling like I'm going to fall looking to right side   Activity #3 Score:  7      Details:   Going down stairs.   TOTAL SCORE:  7   (PSFS Scoring:  0=Extreme difficulty completing task     10=Performs task easily)   Comments:  4(03/29/24), 7(04/26/24)Dynamic Gait Index      Gait - level surface:  3      Change in gait speed:  3      Gait with horizontal head turns:  2      Gait with vertical head turns:  3      Gait and pivot turn:  3      Step over obstacle:  3      Step around obstacle:  3      Stairs:  2   Total Score:  22       Scores of 19 or less have been related to increased incidence of falls in the elderly   Comments:  16(03/28/24), Note gait with horizontal head turns is borderline severe impairment., vs 22(04/26/24)                                      Treatment Provided This VisitCPT Code Interventions Timed Minutes Untimed Minutes Total Minutes Therapeutic Exercise (97110) Re-eval:30 sec sit to stand:8 Reps, no UE assist((age related norm is 9-14reps)(IE) vs 10(04/27/23-noting low thoracic pain and ant hip pain towards end of 30 sec. TUG:  13.41s, 11.73s(IE) vs 11sec, 9 sec(04/26/24)DGI: 16 vs 22(04/26/24)Treatment concluded with ice to thoracic region.   30 N/A     N/A     N/A       Total Treatment Time: 30 Problem ListDifficulty turning head and walking(path deviation)Slow pace of gait, short stride, narrow BOS, minimal foot clearanceMild LE weakness noted by 30 sec sit to stand testFall Risk per DGI.Assessment12/31/25:  Pt has made significant improvements in balance over 8 PT sessions, despite having a T9 compression fracture, most likely sustained 3 wks ago after lifting a friends walker out of a car.  Note pt did not have thoracic pain at time of IE.  Suggest continue PT 1x/wk for 3-4 wks to continue to progress with functional strength and balance.  Anticipate d/c in 3-4wks. 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 Differential includes inner ear issues. Compensatory gait pattern.- Scheduled physical therapy twice a week for one month to improve strength and balance.- Instructed on home exercises, narrow BOS and A<>P WS for low level balance activities.- Encouraged use of a cane for longer walks to prevent falls.Orthostatic hypotensionManaged with midodrine . Blood pressure low in the morning but improves with medication. Patient had a significant fall in April of this year, resulting in a head injury.- Continue midodrine  twice daily to manage orthostatic hypotension.- Monitor blood pressure regularly, especially in the morning.PlanTherapeutic Exercise (97110)Manual Therapy (97140)Therapeutic Activity (97530)Self-Care/Home Management (97535)Neuromuscular Re-Education (97112)Frequency:  1x per weekDuration:  4 weeksPlan for Next VisitNarrow BOS with head rotationsStep downs while looking to [facility] with head rotationsSLS with med ball rollBraidingLat walking vs tbandSlow marchGoals(4-6wks):STG's(2-3wks):1. Pt ind. HEP for appropriate balance, coordination and functional strengthening activities.-Ongoing.2. Improved 30 sec sit to stand reps to 14, with no UE assist to facilitate safety with transfers-Not Achieved.LTG's(4-6wks):3. Improved TUG score to 10sec to minimize fall risk with walking to bathroom.-Achieved.4. Improved DGI score to 19 to minimize fall risk when walking outside or uneven surfaces.-Achieved5. 5 floor to stand transfers with supervision to facilitate getting up off floor after fall/exercises.  [1] Past Medical History:Diagnosis Date  Anemia   Bilateral hand pain 11/11/2017  Disease of thyroid gland   Diverticulosis   GERD (gastroesophageal reflux disease)   Heart valve disease   Hyperlipidemia   IBS (irritable bowel syndrome)   Mitral regurgitation   Skin cancer   SLE (systemic lupus erythematosus) (HC Code)  (HC CODE)  [2] Past Surgical History:Procedure Laterality Date  CATARACT REMOVAL/IOL IMPLANT    CHOLECYSTECTOMY    COLONOSCOPY  11/25/2010  Diverticulosis of the sigmoid colon  COLONOSCOPY  02/26/2015  Diverticulosis of the sigmoid colon. Polyps (3 mm to 4 mm) in the mid-ascending colon. (Polypectomy). Polyp (4 mm to 6 mm) in the distal ascending colon. (Polypectomy).   EGD/Colonoscopy  07/20/2005  Nonerosive antral gastritis; diverticulosis coli. Bx shows chronic non specific inflammation  EGD/Colonoscopy  09/11/1999  Normal EGD; sigmoid diverticulosis.   ESOPHAGOGASTRODUODENOSCOPY 04/16/2009  Abnormally visible vascularity with absence of folds in the gastric body compatible with gastric mucosal atrophy. (Biopsy)  MOLE REMOVAL  2006  OOPHORECTOMY  1961  SKIN CANCER EXCISION    R arm (2017) and R ankle (2003)  THYROIDECTOMY  1969

## 2024-05-01 ENCOUNTER — Encounter: Admit: 2024-05-01 | Payer: PRIVATE HEALTH INSURANCE | Attending: Interventional Pain Medicine

## 2024-05-01 DIAGNOSIS — M546 Pain in thoracic spine: Principal | ICD-10-CM

## 2024-05-02 ENCOUNTER — Inpatient Hospital Stay: Admit: 2024-05-02 | Discharge: 2024-05-02 | Payer: Medicare (Managed Care)

## 2024-05-02 DIAGNOSIS — R2681 Unsteadiness on feet: Principal | ICD-10-CM

## 2024-05-02 NOTE — Rehab Daily Note [[id number]]
 [facility] REHABILITATION [facility] [place] [name] Number: [phone] Number: [phone] Therapy Orthopedic Daily NotePatient Name:  Debra AllenMedical Record Number:  [id number] Date of Birth:  [birth year]Therapist:  Medford Calandra, PTReferring Provider:  Schuyler Squires Coiculescu, MDICD-10 Diagnosis(es):Problem List       ICD-10-CM   PT-Gait Instability-03/28/24  Gait instability R26.81 General InformationTherapy Episode of Care   Date of Visit:  05/02/2024    Treatment Number:  9    Date the Treatment Plan was Initiated/Reviewed:  03/28/2024   Progress Report Due Date:  05/27/2024 Medication Review:Current Outpatient Medications Medication Sig  acetaminophen Take 2 tablets (1,000 mg total) by mouth every 6 (six) hours as needed.  azelaic acid  APPLY TOPICALLY TO AFFECTED AREA TWICE DAILY  budesonide  Take 2 capsules (6 mg total) by mouth every morning. (Patient not taking: Reported on 03/14/2024)  calcium  carbonate Take 2.5 tablets (1,500 mg total) by mouth daily.  chlorhexidine  gluconate Swish and spit 15 mLs 2 (two) times daily.  citalopram Take 0.5 tablets (10 mg total) by mouth daily.  cyanocobalamin  Inject 1 mL (1,000 mcg total) into the muscle every 30 (thirty) days.  Cequa  Apply to eye. (Patient not taking: Reported on 03/14/2024)  denosumab  (PROLIA  SUBQ) Inject under the skin. Twice yearly (Patient not taking: Reported on 03/14/2024)  docosahexaenoic acid/epa (FISH OIL  ORAL) Take by mouth daily. -per pt two capsules daily.  docusate sodium  Take 1 capsule (100 mg total) by mouth 2 (two) times daily.  doxycycline  Take 1 capsule (50 mg total) by mouth daily.  famotidine  Take 1 tablet (20 mg total) by mouth 2 (two) times daily.  fluticasone  propionate Use 1 spray in each nostril daily as needed.  gabapentin  Take 1 capsule (300 mg total) by mouth 4 (four) times daily.  glucosamine-chondroitin Take 1 tablet by mouth daily.  hydroxychloroquine TAKE 1 TABLET TWICE A DAY  iron Take 25 mg by mouth Daily @1700 .  levothyroxine  Take 1 tablet (112 mcg total) by mouth daily.  Linzess  TAKE 1 CAPSULE BY MOUTH ONCE DAILY ON AN EMPTY STOMACH AT LEAST 30 MINUTES BEFORE A MEAL AT THE SAME TIME EACH DAY  midodrine  Take 1 tablet (2.5 mg total) by mouth 2 (two) times daily with breakfast and dinner. Do not take any doseslater than evening meal or less than 4 hours before bedtime.  Miebo  (PF)   multivitamin Take 1 capsule by mouth daily.  omega-3 fatty acids  Take 2 capsules (2 g total) by mouth daily.  pantoprazole  Take 1 tablet (40 mg total) by mouth daily.  penicillin  v potassium Take 1 tablet (500 mg total) by mouth every 12 (twelve) hours.  polyethylene glycol as needed.  Daily Fiber (psyllium-aspart) Take 1 packet by mouth as needed.  BD Luer-Lok Syringe Use as directed with B12  trospium  Take 1 tablet (20 mg total) by mouth nightly. SubjectivePt reporting scheduled for upcoming MRI to thoracic spine per Dr. Chandra.  Notes compliance with HEP for balance activities. ResultsRADIOLOGYLacunar infarction: Right superior cerebellar hemisphereT9 compression fractureOther Providers: Avelina Arlean Denison, NP(PCP)Past Medical HistoryPast Medical History[1]Past Surgical HistoryPast Surgical History[2]AllergiesTramadol, Cyclobenzaprine, and Niacin preparationsPain Rating:  4 / 10   Comments:  baseline LBP and neck pain, seeing Dr. Flynn, injections most recently in neck in Sept of 25. Social / Emotional Information:  Lives alone, Adult children living in [place] and [place]. 6 STE, left sided HR going up. Prior Level of Function:  ongoing difficulty walking, dizziness(hx of orthorstatic hypotension.   Change in Status from prior level of  function?  SomewhatObjectiveSpecial TestsMildly positive dysdiadochokinesia L foot taps, left handSlight nystagmus when tracking pen to left and moving head to R.Negative RhombergPositive Sharpened RhombergFunctional MobilityBed Mobility:  IndTransfers:   IndAmbulation:   Ind     Assistive Device:  Occasional cane use with longer walks.Gait AssessmentNear tandem bos during ambulation, mild path deviation, short stride. Minimal foot clearance.Orthopedic Tools/Scales/Outcome MeasuresOutcomes Measure not required for this visit                                      Treatment Provided This VisitCPT Code Interventions Timed Minutes Untimed Minutes Total Minutes Therapeutic Exercise (97110) 10 step up into star balance with contralat LE.Iso lunge with GENTLE trunk rotationSlow march, 5 count, alternatingAlternating march in to SL heel raise, unilat UE assist on //bar. Tandem stance 'rocking chair'(A<>P WS)SLS-15 sec on L,-9 sec on Right, both after multiple attempts.Slow sit to stand, not actually resting on chair. 8 reps.   30 N/A     N/A     N/A       Total Treatment Time: 30 Problem ListDifficulty turning head and walking(path deviation)Slow pace of gait, short stride, narrow BOS, minimal foot clearanceMild LE weakness noted by 30 sec sit to stand testFall Risk per DGI.AssessmentImproved SL stance time.  Notes difficulty with tandem stance and A<>P WS.  Suggest continue PT 1x/wk for 3-4 wks to continue to progress with functional strength and balance.  Anticipate d/c in 3-4wks. 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 Differential includes inner ear issues. Compensatory gait pattern.- Scheduled physical therapy twice a week for one month to improve strength and balance.- Instructed on home exercises, narrow BOS and A<>P WS for low level balance activities.- Encouraged use of a cane for longer walks to prevent falls.Orthostatic hypotensionManaged with midodrine . Blood pressure low in the morning but improves with medication. Patient had a significant fall in April of this year, resulting in a head injury.- Continue midodrine  twice daily to manage orthostatic hypotension.- Monitor blood pressure regularly, especially in the morning.PlanTherapeutic Exercise (97110)Manual Therapy (97140)Therapeutic Activity (97530)Self-Care/Home Management (97535)Neuromuscular Re-Education (97112)Frequency:  1x per weekDuration:  4 weeksPlan for Next VisitContinue functional strengthening and balance.Narrow BOS with head rotationsStep downs while looking to [facility] with head rotationsSLS with med ball rollBraidingLat walking vs tbandSlow marchGoals(4-6wks):STG's(2-3wks):1. Pt ind. HEP for appropriate balance, coordination and functional strengthening activities.-Ongoing.2. Improved 30 sec sit to stand reps to 14, with no UE assist to facilitate safety with transfers-Not Achieved.LTG's(4-6wks):3. Improved TUG score to 10sec to minimize fall risk with walking to bathroom.-Achieved.4. Improved DGI score to 19 to minimize fall risk when walking outside or uneven surfaces.-Achieved5. 5 floor to stand transfers with supervision to facilitate getting up off floor after fall/exercises.  [1] Past Medical History:Diagnosis Date  Anemia   Bilateral hand pain 11/11/2017  Disease of thyroid gland   Diverticulosis   GERD (gastroesophageal reflux disease)   Heart valve disease   Hyperlipidemia   IBS (irritable bowel syndrome)   Mitral regurgitation   Skin cancer   SLE (systemic lupus erythematosus) (HC Code)  (HC CODE)  [2] Past Surgical History:Procedure Laterality Date  CATARACT REMOVAL/IOL IMPLANT    CHOLECYSTECTOMY    COLONOSCOPY  11/25/2010  Diverticulosis of the sigmoid colon  COLONOSCOPY  02/26/2015  Diverticulosis of the sigmoid colon. Polyps (3 mm to 4 mm) in  the mid-ascending colon. (Polypectomy). Polyp (4 mm to 6 mm) in the distal ascending colon. (Polypectomy).   EGD/Colonoscopy  07/20/2005  Nonerosive antral gastritis; diverticulosis coli. Bx shows chronic non specific inflammation  EGD/Colonoscopy  09/11/1999  Normal EGD; sigmoid diverticulosis.   ESOPHAGOGASTRODUODENOSCOPY  04/16/2009  Abnormally visible vascularity with absence of folds in the gastric body compatible with gastric mucosal atrophy. (Biopsy)  MOLE REMOVAL  2006  OOPHORECTOMY  1961  SKIN CANCER EXCISION    R arm (2017) and R ankle (2003)  THYROIDECTOMY  1969

## 2024-05-04 ENCOUNTER — Ambulatory Visit: Admit: 2024-05-04 | Payer: Medicare (Managed Care)

## 2024-05-08 ENCOUNTER — Inpatient Hospital Stay: Admit: 2024-05-08 | Discharge: 2024-05-08 | Payer: PRIVATE HEALTH INSURANCE

## 2024-05-08 ENCOUNTER — Encounter: Admit: 2024-05-08 | Payer: PRIVATE HEALTH INSURANCE | Attending: Endocrinology, Diabetes & Metabolism

## 2024-05-08 VITALS — BP 126/70 | HR 58 | Ht 67.5 in | Wt 106.0 lb

## 2024-05-08 DIAGNOSIS — K5790 Diverticulosis of intestine, part unspecified, without perforation or abscess without bleeding: Principal | ICD-10-CM

## 2024-05-08 DIAGNOSIS — C4490 Unspecified malignant neoplasm of skin, unspecified: Secondary | ICD-10-CM

## 2024-05-08 DIAGNOSIS — K589 Irritable bowel syndrome without diarrhea: Secondary | ICD-10-CM

## 2024-05-08 DIAGNOSIS — M329 Systemic lupus erythematosus, unspecified: Secondary | ICD-10-CM

## 2024-05-08 DIAGNOSIS — I340 Nonrheumatic mitral (valve) insufficiency: Secondary | ICD-10-CM

## 2024-05-08 DIAGNOSIS — I38 Endocarditis, valve unspecified: Secondary | ICD-10-CM

## 2024-05-08 DIAGNOSIS — M79641 Pain in right hand: Secondary | ICD-10-CM

## 2024-05-08 DIAGNOSIS — K219 Gastro-esophageal reflux disease without esophagitis: Secondary | ICD-10-CM

## 2024-05-08 DIAGNOSIS — D649 Anemia, unspecified: Secondary | ICD-10-CM

## 2024-05-08 DIAGNOSIS — E039 Hypothyroidism, unspecified: Principal | ICD-10-CM

## 2024-05-08 DIAGNOSIS — M546 Pain in thoracic spine: Principal | ICD-10-CM

## 2024-05-08 DIAGNOSIS — E079 Disorder of thyroid, unspecified: Secondary | ICD-10-CM

## 2024-05-08 DIAGNOSIS — E785 Hyperlipidemia, unspecified: Secondary | ICD-10-CM

## 2024-05-08 MED ORDER — LEVOTHYROXINE 112 MCG TABLET
112 | ORAL_TABLET | Freq: Every day | ORAL | 4 refills | 37.00000 days | Status: AC
Start: 2024-05-08 — End: ?

## 2024-05-08 NOTE — Progress Notes [1]
 Endocrine Visit NoteChief Concern(s): No chief complaint on file. 	HPI Debra [name] is an 79 year old female who is here for f/u hypothyroidism.She is on levothyroxine  112 mcg daily for hypothyroidism. She previously had jitteriness on a higher dose. She currently has no palpitations, shakiness, or temperature intolerance. Her weight is stable at 106 pounds. She desires weight gain and reports a consistent diet without active weight loss.She has irregular bowel movements with intermittent constipation and brief periods of increased bowel movements, but no current diarrhea.Her sleep is disrupted by frequent nocturia despite early bedtime. She has seen urology and completed pelvic floor physical therapy without benefit She has osteoporosis and was on Prolia  for two years. 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 She has not received Prolia  since. Her osteoporosis is treated by her rheumatologistShe sustained a vertebral compression fracture on April 06, 2024 after lifting a walker, with immediate severe back pain. She is having an MRI today to assess the fracture. Prior x-rays and trigger point injections to her neck and spine have only recently provided some relief.She has no chest pain or leg swelling. She attends physical therapy for balance, which she finds helpful.Last visit: She has intermittent diarrhea, with the last severe episode occurring almost a year ago. Budesonide  provides relief for bowel issues. She was diagnosed with a pancreatic enzyme deficiency, which may have contributed to her diarrhea. Currently, she has no diarrhea, flushing, palpitations, or shakiness. She experiences chronic fatigue, poor sleep, and frequent nocturia, disrupting her rest. She goes to bed between 8:30 and 9:00 PM but wakes up around 3:00 AM. She has been on levothyroxine  since her thyroid was removed in 1969, with a regimen of 112 mcg twice a week and 125 mcg five times a week. She experiences cold intolerance and has a family history of thyroid issues on her maternal side. She has experienced weight fluctuations, noting significant weight loss in 2021 during a severe diarrhea episode, dropping from 119 pounds to under 100 pounds. She has since regained some weight, fluctuating around 104 pounds. She experiences low blood pressure, managed with midodrine , but still feels lightheaded most of the time. She reports occasional bloating above her waist and mild swelling in her feet and ankles, which she attributes to her medication regimen.  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  Lupus anticoagulant syndrome (HC Code)  Muscle pain  Neck pain  Straining during bowel movements  Family history of colon cancer, second degree relative   Dyspepsia  Dyspepsia  Elevated ALT measurement  Mitral valve prolapse  Urge incontinence  Nocturia  Pelvic floor dysfunction  Nocturia  Urge incontinence  Unsteady gait when walking  Orthostatic hypotension  Gait instability   Medications Current Medications[1] Allergies Allergies[2] Social History Updated Family History Social History Tobacco Use  Smoking status: Never  Smokeless tobacco: Never Substance Use Topics  Alcohol use: Not Currently   Alcohol/week: 2.0 standard drinks of alcohol   Types: 2 Glasses of wine per week Patient's occupation:  Family History Problem Relation Age of Onset  Diabetes Daughter   Breast cancer Maternal Aunt   Colon cancer Maternal Aunt   Colon cancer Maternal Grandfather  Update: Review of Systems Gen-No weight gain or weight lossCardiovascular-No palpitationsResp-nonsmokerGI: No abd pain, vomiting, diarrheaNeuro: No tremor Objective Vitals:  05/08/24 0922 BP: 126/70 Pulse: ROLLEN)  58 Physical Exam:Physical ExamConstitutional:     General: She is not in acute distress.   Appearance: She is well-developed. Neck:    Comments: Thyroid absentCardiovascular:    Rate and Rhythm: Normal rate and regular rhythm.    Heart sounds: Normal heart sounds. No murmur heard.Pulmonary:    Effort: Pulmonary effort is normal. No respiratory distress. Musculoskeletal:    Cervical back: Neck supple. Skin:   General: Skin is warm and dry. Neurological:    Mental Status: She is alert and oriented to person, place, and time. Psychiatric:       Behavior: Behavior normal.  Recent Lab  Component    Latest Ref Rng 04/13/2024 Thyroid Stimulating Hormone, 3rd Gen.    0.36 - 3.74 uIU/mL 4.38 (H)  Free T4    0.76 - 1.46 ng/dL 8.89   Legend:(H) High Assessment & Plan                                                                                                           ICD-10-CM  1. Hypothyroidism (acquired)  E03.9 TSH w/reflex to FT4     (BH GH LMW Q YH)  Assessment & PlanHypothyroidismWell-managed with levothyroxine  112 mcg daily. TSH 4.3, slightly elevated but age-appropriate. No hyperthyroid symptoms. Weight stable. Current TSH protective for heart and bone health.- Continue levothyroxine  112 mcg daily.- Ordered thyroid function tests before next visit or if symptoms change.- Sent refill for levothyroxine  to pharmacy.She has osteoporosis currently managed by rheumatology. Discussed that if she'd like assistance with this issue in the future, to notify us  and we will set up appt to addressFor all reported symptoms not discussed above the patient was referred to their primary medical doctor.Dictation recognition software utilized for this note. Please excuse minor errors.I provided a concise overview of the ambient note generation solution. [name] or their legally authorized representative verbally consented to a temporary audio recording of their visit to assist with completing the visit documentation using an AI-powered solution. This note was reviewed for accuracy by Vernard Shin who performed the clinical service.  [1] Current Outpatient Medications:   acetaminophen (TYLENOL) 500 MG tablet, Take 2 tablets (1,000 mg total) by mouth every 6 (six) hours as needed., Disp: , Rfl:   azelaic acid  (FINACEA ) 15 % gel, APPLY TOPICALLY TO AFFECTED AREA TWICE DAILY, Disp: , Rfl:   budesonide  (ENTOCORT EC ) 3 mg DR-ER 24 hr capsule, Take 2 capsules (6 mg total) by mouth every morning. (Patient not taking: Reported on 03/14/2024), Disp: , Rfl:   calcium  carbonate (CALTRATE 600) 1500 mg (600 mg calcium ) tablet, Take 2.5 tablets (1,500 mg total) by mouth daily., Disp: , Rfl:   chlorhexidine  gluconate (PERIDEX ) 0.12 % solution, Swish and spit 15 mLs 2 (two) times daily., Disp: , Rfl:   citalopram (CELEXA) 20 MG tablet, Take 0.5 tablets (10 mg total) by mouth daily., Disp: , Rfl: 0  cyanocobalamin  1,000 mcg/mL injection vial, Inject 1 mL (1,000 mcg total) into the muscle every 30 (thirty) days., Disp: , Rfl:   cycloSPORINE  (CEQUA ) 0.09 % Dpet,  Apply to eye. (Patient not taking: Reported on 03/14/2024), Disp: , Rfl:   denosumab  (PROLIA  SUBQ), Inject under the skin. Twice yearly (Patient not taking: Reported on 03/14/2024), Disp: , Rfl:   docosahexaenoic acid/epa (FISH OIL  ORAL), Take by mouth daily. -per pt two capsules daily., Disp: , Rfl:   docusate sodium  (COLACE) 100 mg capsule, Take 1 capsule (100 mg total) by mouth 2 (two) times daily., Disp: , Rfl:   doxycycline  (MONODOX ) 50 mg capsule, Take 1 capsule (50 mg total) by mouth daily., Disp: , Rfl:   famotidine  (PEPCID ) 20 mg tablet, Take 1 tablet (20 mg total) by mouth 2 (two) times daily., Disp: , Rfl:   fluticasone  propionate (FLONASE ) 50 mcg/actuation nasal spray, Use 1 spray in each nostril daily as needed., Disp: , Rfl:   gabapentin  (NEURONTIN ) 300 mg capsule, Take 1 capsule (300 mg total) by mouth 4 (four) times daily., Disp: , Rfl:   glucosamine-chondroitin 500-400 mg tablet, Take 1 tablet by mouth daily., Disp: , Rfl:   hydroxychloroquine (PLAQUENIL) 200 mg tablet, TAKE 1 TABLET TWICE A DAY, Disp: , Rfl: 0  iron 18 mg Tab, Take 25 mg by mouth Daily @1700 ., Disp: , Rfl:   levothyroxine  (SYNTHROID , LEVOTHROID) 112 MCG tablet, Take 1 tablet (112 mcg total) by mouth daily., Disp: 90 tablet, Rfl: 0  LINZESS  290 mcg capsule, TAKE 1 CAPSULE BY MOUTH ONCE DAILY ON AN EMPTY STOMACH AT LEAST 30 MINUTES BEFORE A MEAL AT THE SAME TIME EACH DAY, Disp: , Rfl:   midodrine  (PROAMATINE ) 2.5 mg tablet, Take 1 tablet (2.5 mg total) by mouth 2 (two) times daily with breakfast and dinner. Do not take any doseslater than evening meal or less than 4 hours before bedtime., Disp: , Rfl:   MIEBO , PF, 100 % Drop, , Disp: , Rfl:   multivitamin capsule, Take 1 capsule by mouth daily., Disp: , Rfl:   omega-3 fatty acids  1,000 mg capsule, Take 2 capsules (2 g total) by mouth daily., Disp: , Rfl:   pantoprazole  (PROTONIX ) 40 mg tablet, Take 1 tablet (40 mg total) by mouth daily., Disp: , Rfl:   penicillin  v potassium (VEETID) 500 mg tablet, Take 1 tablet (500 mg total) by mouth every 12 (twelve) hours., Disp: , Rfl:   polyethylene glycol (MIRALAX) 17 gram/dose powder, as needed., Disp: , Rfl:   psyllium husk, aspartame, (DAILY FIBER, PSYLLIUM-ASPART,) 3.4 gram packet, Take 1 packet by mouth as needed., Disp: , Rfl:   syringe with needle (BD LUER-LOK SYRINGE) 3 mL 25 x 5/8, Use as directed with B12, Disp: 6 each, Rfl: 1  trospium  (SANCTURA ) 20 mg tablet, Take 1 tablet (20 mg total) by mouth nightly., Disp: 30 tablet, Rfl: 11[2] AllergiesAllergen Reactions  Tramadol Vomiting  Cyclobenzaprine Rash  Niacin Preparations Rash

## 2024-05-11 ENCOUNTER — Inpatient Hospital Stay: Admit: 2024-05-11 | Discharge: 2024-05-11 | Payer: PRIVATE HEALTH INSURANCE

## 2024-05-11 ENCOUNTER — Inpatient Hospital Stay: Admit: 2024-05-11 | Discharge: 2024-05-11 | Payer: Medicare (Managed Care)

## 2024-05-11 ENCOUNTER — Encounter: Admit: 2024-05-11 | Payer: PRIVATE HEALTH INSURANCE

## 2024-05-11 DIAGNOSIS — M810 Age-related osteoporosis without current pathological fracture: Principal | ICD-10-CM

## 2024-05-11 DIAGNOSIS — Z79899 Other long term (current) drug therapy: Secondary | ICD-10-CM

## 2024-05-11 DIAGNOSIS — R2681 Unsteadiness on feet: Secondary | ICD-10-CM

## 2024-05-11 LAB — CREATININE/EGFR     (GH LMW Q YH)
BKR CREATININE DELTA: 0.08
BKR CREATININE: 0.73 mg/dL (ref 0.55–1.02)
BKR EGFR, CREATININE (CKD-EPI 2021): >60 mL/min/1.73m2 (ref >=60)

## 2024-05-11 LAB — CALCIUM: BKR CALCIUM: 9.8 mg/dL (ref 8.5–10.1)

## 2024-05-11 NOTE — Rehab Daily Note [[id number]]
 [facility] REHABILITATION [facility] [place] [name] Number: [phone] Number: [id number] Therapy Orthopedic Daily NotePatient Name:  Debra AllenMedical Record Number:  [id number] Date of Birth:  04-Apr-1947Therapist:  Medford Calandra, PTReferring Provider:  Schuyler Squires Coiculescu, MDICD-10 Diagnosis(es):Problem List       ICD-10-CM   PT-Gait Instability-03/28/24  Gait instability R26.81 General InformationTherapy Episode of Care   Date of Visit:  05/11/2024    Treatment Number:  10    Date the Treatment Plan was Initiated/Reviewed:  03/28/2024   Progress Report Due Date:  05/27/2024 Medication Review:Current Outpatient Medications Medication Sig  acetaminophen Take 2 tablets (1,000 mg total) by mouth every 6 (six) hours as needed.  azelaic acid  APPLY TOPICALLY TO AFFECTED AREA TWICE DAILY  calcium  carbonate Take 2.5 tablets (1,500 mg total) by mouth daily.  chlorhexidine  gluconate Swish and spit 15 mLs 2 (two) times daily.  citalopram Take 0.5 tablets (10 mg total) by mouth daily.  cyanocobalamin  Inject 1 mL (1,000 mcg total) into the muscle every 30 (thirty) days.  denosumab  (PROLIA  SUBQ) Inject under the skin. Twice yearly (Patient not taking: Reported on 05/08/2024)  docusate sodium  Take 1 capsule (100 mg total) by mouth 2 (two) times daily.  doxycycline  Take 1 capsule (50 mg total) by mouth daily.  famotidine  Take 1 tablet (20 mg total) by mouth 2 (two) times daily.  fluticasone  propionate Use 1 spray in each nostril daily as needed.  gabapentin  Take 1 capsule (300 mg total) by mouth 4 (four) times daily.  glucosamine-chondroitin Take 1 tablet by mouth daily.  hydroxychloroquine TAKE 1 TABLET TWICE A DAY  iron Take 25 mg by mouth Daily @1700 .  levothyroxine  Take 1 tablet (112 mcg total) by mouth daily.  Linzess  TAKE 1 CAPSULE BY MOUTH ONCE DAILY ON AN EMPTY STOMACH AT LEAST 30 MINUTES BEFORE A MEAL AT THE SAME TIME EACH DAY  midodrine  Take 1 tablet (2.5 mg total) by mouth 2 (two) times daily with breakfast and dinner. Do not take any doseslater than evening meal or less than 4 hours before bedtime.  Miebo  (PF)   multivitamin Take 1 capsule by mouth daily.  omega-3 fatty acids  Take 2 capsules (2 g total) by mouth daily.  pantoprazole  Take 1 tablet (40 mg total) by mouth daily.  penicillin  v potassium Take 1 tablet (500 mg total) by mouth every 12 (twelve) hours. (Patient not taking: Reported on 05/08/2024)  polyethylene glycol as needed.  Daily Fiber (psyllium-aspart) Take 1 packet by mouth as needed.  BD Luer-Lok Syringe Use as directed with B12  trospium  Take 1 tablet (20 mg total) by mouth nightly. SubjectivePt had MRI and TP injections.  Ongoing thoracic pain.  Feels balance is better.  ResultsRADIOLOGYLacunar infarction: Right superior cerebellar hemisphereT9 compression fractureOther Providers: Avelina Arlean Denison, NP(PCP)Past Medical HistoryPast Medical History[1]Past Surgical HistoryPast Surgical History[2]AllergiesTramadol, Cyclobenzaprine, and Niacin preparationsPain Rating:  4 / 10   Comments:  baseline LBP and neck pain, seeing Dr. Flynn, injections most recently in neck in Sept of 25. Social / Emotional Information:  Lives alone, [place] in [place]. 6 STE, left sided HR going up. Prior Level of Function:  ongoing difficulty walking, dizziness(hx of orthorstatic hypotension.   Change in Status from prior level of function?  SomewhatObjectiveSpecial TestsMildly positive dysdiadochokinesia L foot taps, left handSlight nystagmus when tracking pen to left and moving head to R.Negative RhombergPositive Sharpened RhombergFunctional MobilityBed Mobility:  IndTransfers:   IndAmbulation:   Ind     Assistive Device:  Occasional cane use with longer walks.Gait AssessmentNear tandem bos during ambulation, mild path deviation, short stride. Minimal foot clearance.Orthopedic Tools/Scales/Outcome MeasuresOutcomes Measure not required for this visit                                      Treatment Provided This VisitCPT Code Interventions Timed Minutes Untimed Minutes Total Minutes Therapeutic Exercise (97110) 10 step up into star balance with contralat LE.Slow march, 5 count, alternatingAlternating march in to SL heel raise, unilat UE assist on //bar. Tandem stance 'rocking chair'(A<>P WS)Floor to stand x 2 with CG.Slow sit to stand, not actually resting on chair. 8 reps.   30 N/A     N/A     N/A       Total Treatment Time: 30 Problem ListDifficulty turning head and walking(path deviation)Slow pace of gait, short stride, narrow BOS, minimal foot clearanceMild LE weakness noted by 30 sec sit to stand testFall Risk per DGI.AssessmentImproved SL stance time.  Notes difficulty with tandem stance and A<>P WS.  Anticipate D/c in 1 visit.  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 Differential includes inner ear issues. Compensatory gait pattern.- Scheduled physical therapy twice a week for one month to improve strength and balance.- Instructed on home exercises, narrow BOS and A<>P WS for low level balance activities.- Encouraged use of a cane for longer walks to prevent falls.Orthostatic hypotensionManaged with midodrine . Blood pressure low in the morning but improves with medication. Patient had a significant fall in April of this year, resulting in a head injury.- Continue midodrine  twice daily to manage orthostatic hypotension.- Monitor blood pressure regularly, especially in the morning.PlanTherapeutic Exercise (97110)Manual Therapy (97140)Therapeutic Activity (97530)Self-Care/Home Management (97535)Neuromuscular Re-Education (97112)Frequency:  1x per weekDuration:  4 weeksPlan for Next VisitContinue functional strengthening and balance.Narrow BOS with head rotationsStep downs [facility] with [facility] rotationsSLS with med ball rollBraidingLat walking vs tbandSlow marchGoals(4-6wks):STG's(2-3wks):1. Pt ind. HEP for appropriate balance, coordination and functional strengthening activities.-Ongoing.2. Improved 30 sec sit to stand reps to 14, with no UE assist to facilitate safety with transfers-Not Achieved.LTG's(4-6wks):3. Improved TUG score to 10sec to minimize fall risk with walking to bathroom.-Achieved.4. Improved DGI score to 19 to minimize fall risk when walking outside or uneven surfaces.-Achieved5. 5 floor to stand transfers with supervision to facilitate getting up off floor after fall/exercises.  [1] Past Medical History:Diagnosis Date  Anemia   Bilateral hand pain 11/11/2017  Disease of thyroid gland   Diverticulosis   GERD (gastroesophageal reflux disease)   Heart valve disease   Hyperlipidemia   IBS (irritable bowel syndrome)   Mitral regurgitation   Skin cancer   SLE (systemic lupus erythematosus) (HC Code)  (HC CODE)  [2] Past Surgical History:Procedure Laterality Date  CATARACT REMOVAL/IOL IMPLANT    CHOLECYSTECTOMY    COLONOSCOPY  11/25/2010  Diverticulosis of the sigmoid colon  COLONOSCOPY  02/26/2015  Diverticulosis of the sigmoid colon. Polyps (3 mm to 4 mm) in the mid-ascending colon. (Polypectomy). Polyp (4 mm to 6 mm) in the distal ascending colon. (Polypectomy).   EGD/Colonoscopy  07/20/2005 Nonerosive antral gastritis; diverticulosis coli. Bx shows chronic non specific inflammation  EGD/Colonoscopy  09/11/1999  Normal EGD; sigmoid diverticulosis.   ESOPHAGOGASTRODUODENOSCOPY  04/16/2009  Abnormally visible vascularity with absence  of folds in the gastric body compatible with gastric mucosal atrophy. (Biopsy)  MOLE REMOVAL  2006  OOPHORECTOMY  1961  SKIN CANCER EXCISION    R arm (2017) and R ankle (2003)  THYROIDECTOMY  1969

## 2024-05-18 ENCOUNTER — Ambulatory Visit: Admit: 2024-05-18 | Payer: PRIVATE HEALTH INSURANCE

## 2024-05-18 DIAGNOSIS — M810 Age-related osteoporosis without current pathological fracture: Secondary | ICD-10-CM

## 2024-05-18 DIAGNOSIS — Z79899 Other long term (current) drug therapy: Secondary | ICD-10-CM

## 2024-05-18 DIAGNOSIS — R2681 Unsteadiness on feet: Principal | ICD-10-CM

## 2024-05-26 ENCOUNTER — Ambulatory Visit: Admit: 2024-05-26 | Payer: PRIVATE HEALTH INSURANCE | Attending: Urology

## 2024-05-26 ENCOUNTER — Encounter: Admit: 2024-05-26 | Payer: PRIVATE HEALTH INSURANCE | Attending: Urology

## 2024-05-26 DIAGNOSIS — R351 Nocturia: Secondary | ICD-10-CM

## 2024-05-26 DIAGNOSIS — K589 Irritable bowel syndrome without diarrhea: Secondary | ICD-10-CM

## 2024-05-26 DIAGNOSIS — K5790 Diverticulosis of intestine, part unspecified, without perforation or abscess without bleeding: Principal | ICD-10-CM

## 2024-05-26 DIAGNOSIS — K219 Gastro-esophageal reflux disease without esophagitis: Secondary | ICD-10-CM

## 2024-05-26 DIAGNOSIS — I340 Nonrheumatic mitral (valve) insufficiency: Secondary | ICD-10-CM

## 2024-05-26 DIAGNOSIS — C4490 Unspecified malignant neoplasm of skin, unspecified: Secondary | ICD-10-CM

## 2024-05-26 DIAGNOSIS — M79641 Pain in right hand: Secondary | ICD-10-CM

## 2024-05-26 DIAGNOSIS — M329 Systemic lupus erythematosus, unspecified: Secondary | ICD-10-CM

## 2024-05-26 DIAGNOSIS — D649 Anemia, unspecified: Secondary | ICD-10-CM

## 2024-05-26 DIAGNOSIS — E785 Hyperlipidemia, unspecified: Secondary | ICD-10-CM

## 2024-05-26 DIAGNOSIS — M6289 Other specified disorders of muscle: Secondary | ICD-10-CM

## 2024-05-26 DIAGNOSIS — I38 Endocarditis, valve unspecified: Secondary | ICD-10-CM

## 2024-05-26 DIAGNOSIS — E079 Disorder of thyroid, unspecified: Secondary | ICD-10-CM

## 2024-05-27 DIAGNOSIS — N3941 Urge incontinence: Secondary | ICD-10-CM

## 2024-05-27 DIAGNOSIS — N399 Disorder of urinary system, unspecified: Principal | ICD-10-CM

## 2024-05-29 ENCOUNTER — Inpatient Hospital Stay: Admit: 2024-05-29 | Payer: PRIVATE HEALTH INSURANCE

## 2024-05-29 DIAGNOSIS — R2681 Unsteadiness on feet: Principal | ICD-10-CM

## 2024-06-07 ENCOUNTER — Ambulatory Visit: Admit: 2024-06-07 | Payer: PRIVATE HEALTH INSURANCE

## 2024-09-14 ENCOUNTER — Encounter: Admit: 2024-09-14 | Payer: PRIVATE HEALTH INSURANCE | Attending: Neurology

## 2024-11-06 ENCOUNTER — Encounter: Admit: 2024-11-06 | Payer: PRIVATE HEALTH INSURANCE | Attending: Endocrinology, Diabetes & Metabolism

## 2024-11-23 ENCOUNTER — Encounter: Admit: 2024-11-23 | Payer: PRIVATE HEALTH INSURANCE | Attending: Urology
